# Patient Record
Sex: Male | Born: 1955 | Race: White | Hispanic: No | Marital: Married | Smoking: Former smoker
Health system: Southern US, Community
[De-identification: ages and names within clinical notes are randomized; demographics above are authoritative.]

## PROBLEM LIST (undated history)

## (undated) DIAGNOSIS — I2510 Atherosclerotic heart disease of native coronary artery without angina pectoris: Secondary | ICD-10-CM

## (undated) DIAGNOSIS — E559 Vitamin D deficiency, unspecified: Secondary | ICD-10-CM

## (undated) DIAGNOSIS — E7800 Pure hypercholesterolemia, unspecified: Secondary | ICD-10-CM

## (undated) DIAGNOSIS — K219 Gastro-esophageal reflux disease without esophagitis: Secondary | ICD-10-CM

## (undated) DIAGNOSIS — I10 Essential (primary) hypertension: Secondary | ICD-10-CM

## (undated) DIAGNOSIS — M5416 Radiculopathy, lumbar region: Secondary | ICD-10-CM

## (undated) DIAGNOSIS — E66811 Obesity, class 1: Secondary | ICD-10-CM

## (undated) DIAGNOSIS — I471 Supraventricular tachycardia, unspecified: Secondary | ICD-10-CM

## (undated) DIAGNOSIS — Z87891 Personal history of nicotine dependence: Secondary | ICD-10-CM

## (undated) DIAGNOSIS — I350 Nonrheumatic aortic (valve) stenosis: Secondary | ICD-10-CM

## (undated) DIAGNOSIS — M5412 Radiculopathy, cervical region: Secondary | ICD-10-CM

## (undated) DIAGNOSIS — E119 Type 2 diabetes mellitus without complications: Secondary | ICD-10-CM

## (undated) DIAGNOSIS — G894 Chronic pain syndrome: Secondary | ICD-10-CM

## (undated) HISTORY — PX: OTHER SURGICAL HISTORY: SHX169

## (undated) HISTORY — DX: Supraventricular tachycardia, unspecified: I47.10

## (undated) HISTORY — DX: Radiculopathy, cervical region: M54.12

## (undated) HISTORY — DX: Type 2 diabetes mellitus without complications: E11.9

## (undated) HISTORY — DX: Nonrheumatic aortic (valve) stenosis: I35.0

## (undated) HISTORY — DX: Obesity, class 1: E66.811

## (undated) HISTORY — DX: Gastro-esophageal reflux disease without esophagitis: K21.9

## (undated) HISTORY — DX: Atherosclerotic heart disease of native coronary artery without angina pectoris: I25.10

## (undated) HISTORY — PX: TRANSTHORACIC ECHOCARDIOGRAM: SHX275

## (undated) HISTORY — DX: Radiculopathy, lumbar region: M54.16

## (undated) HISTORY — DX: Vitamin D deficiency, unspecified: E55.9

## (undated) HISTORY — DX: Personal history of nicotine dependence: Z87.891

## (undated) HISTORY — DX: Chronic pain syndrome: G89.4

## (undated) SURGICAL SUPPLY — 1 items: INQWIRE 1.5J .035X260CM (WIRE) ×1 IMPLANT

---

## 2003-09-19 ENCOUNTER — Ambulatory Visit (HOSPITAL_COMMUNITY): Admission: RE | Admit: 2003-09-19 | Discharge: 2003-09-19 | Payer: Self-pay | Admitting: Cardiovascular Disease

## 2004-09-08 ENCOUNTER — Ambulatory Visit (HOSPITAL_COMMUNITY): Admission: RE | Admit: 2004-09-08 | Discharge: 2004-09-08 | Payer: Self-pay | Admitting: Internal Medicine

## 2009-12-12 ENCOUNTER — Ambulatory Visit (HOSPITAL_COMMUNITY): Admission: RE | Admit: 2009-12-12 | Discharge: 2009-12-12 | Payer: Self-pay | Admitting: Internal Medicine

## 2010-11-13 NOTE — Cardiovascular Report (Signed)
NAME:  Alan Grant, Alan Grant                         ACCOUNT NO.:  [id number]   MEDICAL RECORD NO.:  [id number]                   PATIENT TYPE:  OIB   LOCATION:  2899                                 FACILITY:  [facility]   PHYSICIAN:  Cristy Hilts. Jacinto Halim, M.D.                  DATE OF BIRTH:  May 16, 1956   DATE OF PROCEDURE:  09/19/2003  DATE OF DISCHARGE:  09/19/2003                              CARDIAC CATHETERIZATION   PROCEDURE PERFORMED:  1. Left ventriculography.  2. Selective right and left coronary arteriography.  3. Ascending aortogram.  4. Abdominal aortogram.  5. Right femoral angiography and closure of right femoral arterial access     with Angio-Seal.   INDICATION:  Mr. Alan Grant is a 55 year old Caucasian male with family  history of coronary artery disease and history of hyperlipidemia and  hypertension who was admitted through the office after he presented to the  office complaining of chest discomfort.  Although the chest pain had some  atypical qualities, he did have significant cardiac risk factors and hence  she was admitted to [facility] in outpatient setting for cardiac  catheterization.  This is being done to evaluate for coronary artery disease  and also to evaluate the coronary anatomy.   HEMODYNAMIC DATA:  1. The left ventricular pressures were 120/6 with end-diastolic pressure of     10 mmHg.  2. Aortic pressure 108/83 with a mean of 96 mmHg.  3. There was no pressure gradient across the aortic valve.   ANGIOGRAPHIC DATA:  Left ventricle:  Left ventricular systolic function was  normal and ejection fraction was estimated at 65%.  There is no significant  mitral regurgitation.   Right coronary artery:  The right coronary artery is a small vessel and is  nondominant.   Left main coronary artery:  Left main coronary artery is a large caliber  vessel. It is normal.   Circumflex coronary artery:  Circumflex coronary artery is a large caliber  vessel.  It gives  origin to a large obtuse marginal one and continues in the  distal territory as an obtuse marginal two and also gives origin to a PDA.  It is normal.   Left anterior descending artery:  Left anterior descending artery is a large  caliber vessel.  It gives origin to several small diagonals.  It is normal.  It wraps around the apex.   ASCENDING AORTOGRAM:  Revealed presence of three aortic valve cusps.  There  is no evidence of aortic regurgitation.  No evidence of aortic dissection.   ABDOMINAL AORTOGRAM:  Revealed presence of two renal arteries; one on either  side.  The left renal artery was normally situated.  However, the right  renal artery rose apparently very close to the aortoiliac bifurcation.  Otherwise, the renal arteries were normal.   There is a possible hiatal hernia noted during angiography.   IMPRESSION:  1. Normal  coronary arteries with normal left ventricular systolic function.  2. Apparent origin of the right renal artery close to the aortoiliac     bifurcation.  However, the lumen is normal of both the right and left.   RECOMMENDATIONS:  Evaluation for noncardiac cause of chest pain is  indicated.  Smoking cessation is indicated.  The patient will be continued  on proton pump inhibitors on a twice a day dose.  The patient will follow up  with Dr. Gabriel Grant.  If he continues to have recurrent chest pain,  further evaluation including GI evaluation may be indicated.   TECHNIQUE OF PROCEDURE:  Under usual sterile precautions, using a 6 French  right femoral arterial access, a 6 [place] multipurpose pigtail catheter was  advanced into the ascending aorta over a 0.035-inch J wire.  The catheter  was then gently advanced to the left ventricle and left ventricular  pressures were monitored.  Hand contrast injection of the left ventricle was  performed both in the LAO and RAO projection.  The catheter was flushed with  saline and pulled back into the ascending aorta  and pressure gradient across  the aortic valve was monitored.  The right coronary artery was selectively  engaged and angiography was performed.  In a similar fashion, left main  coronary artery was selectively engaged and angiography was performed.  Then, the catheter was pulled back through to the aorta and ascending  aortogram was performed and left ventricular catheter was pulled back into  the abdominal aorta and abdominal aortogram was performed.  Right  ventricular angiography was performed through the arterial access sheath and  access was closed with Angio-Seal with excellent hemostasis.  The patient  was transferred to recovery in stable condition.  The patient tolerated the  procedure well.                                               Cristy Hilts. Jacinto Halim, M.D.    Pilar Plate  D:  09/19/2003  T:  09/20/2003  Job:  [id number]

## 2014-07-28 ENCOUNTER — Encounter (HOSPITAL_COMMUNITY): Payer: Self-pay | Admitting: *Deleted

## 2014-07-28 DIAGNOSIS — Z9889 Other specified postprocedural states: Secondary | ICD-10-CM | POA: Insufficient documentation

## 2014-07-28 DIAGNOSIS — Z720 Tobacco use: Secondary | ICD-10-CM | POA: Diagnosis not present

## 2014-07-28 DIAGNOSIS — N433 Hydrocele, unspecified: Secondary | ICD-10-CM | POA: Diagnosis not present

## 2014-07-28 DIAGNOSIS — I10 Essential (primary) hypertension: Secondary | ICD-10-CM | POA: Diagnosis not present

## 2014-07-28 DIAGNOSIS — Z79899 Other long term (current) drug therapy: Secondary | ICD-10-CM | POA: Diagnosis not present

## 2014-07-28 DIAGNOSIS — N451 Epididymitis: Secondary | ICD-10-CM | POA: Diagnosis not present

## 2014-07-28 DIAGNOSIS — N508 Other specified disorders of male genital organs: Secondary | ICD-10-CM | POA: Diagnosis present

## 2014-07-28 NOTE — ED Notes (Signed)
Pt c/o pain and swelling to right side of scrotum that started this am

## 2014-07-29 ENCOUNTER — Emergency Department (HOSPITAL_COMMUNITY)
Admission: EM | Admit: 2014-07-29 | Discharge: 2014-07-29 | Disposition: A | Discharge status: Home / Self Care | Payer: Managed Care, Other (non HMO) | Attending: Emergency Medicine | Admitting: Emergency Medicine

## 2014-07-29 ENCOUNTER — Emergency Department (HOSPITAL_COMMUNITY): Payer: Managed Care, Other (non HMO)

## 2014-07-29 DIAGNOSIS — N5089 Other specified disorders of the male genital organs: Secondary | ICD-10-CM

## 2014-07-29 DIAGNOSIS — N433 Hydrocele, unspecified: Secondary | ICD-10-CM

## 2014-07-29 DIAGNOSIS — N451 Epididymitis: Secondary | ICD-10-CM

## 2014-07-29 DIAGNOSIS — N50811 Right testicular pain: Secondary | ICD-10-CM

## 2014-07-29 HISTORY — DX: Essential (primary) hypertension: I10

## 2014-07-29 LAB — URINALYSIS, ROUTINE W REFLEX MICROSCOPIC
Bilirubin Urine: NEGATIVE
Glucose, UA: NEGATIVE mg/dL
HGB URINE DIPSTICK: NEGATIVE
Ketones, ur: NEGATIVE mg/dL
Leukocytes, UA: NEGATIVE
Nitrite: NEGATIVE
Protein, ur: NEGATIVE mg/dL
Specific Gravity, Urine: 1.025 (ref 1.005–1.030)
UROBILINOGEN UA: 0.2 mg/dL (ref 0.0–1.0)
pH: 6 (ref 5.0–8.0)

## 2014-07-29 MED ORDER — LEVOFLOXACIN 500 MG PO TABS: 500.0000 mg | ORAL_TABLET | Freq: Every day | ORAL | Status: DC

## 2014-07-29 MED ORDER — FENTANYL CITRATE 0.05 MG/ML IJ SOLN
50.0000 ug | Freq: Once | INTRAMUSCULAR | Status: AC
  Administered 2014-07-29: 50 ug via NASAL
  Filled 2014-07-29: qty 2

## 2014-07-29 MED ORDER — NAPROXEN 500 MG PO TABS: ORAL_TABLET | ORAL | Status: DC

## 2014-07-29 MED ORDER — NAPROXEN 250 MG PO TABS
500.0000 mg | ORAL_TABLET | Freq: Once | ORAL | Status: AC
  Administered 2014-07-29: 500 mg via ORAL
  Filled 2014-07-29: qty 2

## 2014-07-29 MED ORDER — LEVOFLOXACIN 500 MG PO TABS
500.0000 mg | ORAL_TABLET | Freq: Once | ORAL | Status: AC
  Administered 2014-07-29: 500 mg via ORAL
  Filled 2014-07-29: qty 1

## 2014-07-29 NOTE — ED Provider Notes (Signed)
CSN: [id number]     Arrival date & time 07/28/14  2337 History   First MD Initiated Contact with Patient 07/29/14 0117     Chief Complaint  Patient presents with  . Testicle Pain     (Consider location/radiation/quality/duration/timing/severity/associated sxs/prior Treatment) HPI  Patient reports about 10:00 this morning he started have swelling and pain in his right scrotum. He states he is able to urinate normally. He denies any prior history of boils or abscesses. He denies fever, nausea, or vomiting. He states he's never had this before. He states that walking or changing positions makes the pain worse. Nothing makes it feel better. He does report he had a vasectomy done about 15 years ago. PCP Dr Gerarda Fraction  Past Medical History  Diagnosis Date  . Hypertension    Past Surgical History  Procedure Laterality Date  . Cardiac catherization     History reviewed. No pertinent family history. History  Substance Use Topics  . Smoking status: Current Every Day Smoker -- 1.00 packs/day  . Smokeless tobacco: Not on file  . 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     Allergies  Review of patient's allergies indicates no known allergies.  Home Medications   Prior to Admission medications   Medication Sig Start Date End Date Taking? Authorizing Provider  ALPRAZolam Duanne Moron) 1 MG tablet Take 1 mg by mouth 4 (four) times daily.   Yes Historical Provider, MD  oxyCODONE (OXY IR/ROXICODONE) 5 MG immediate release tablet Take 30 mg by mouth every 4 (four) hours.   Yes Historical Provider, MD  oxycodone (OXY-IR) 5 MG capsule Take 60 mg by mouth 2 (two) times daily.   Yes Historical Provider, MD  traMADol (ULTRAM) 50 MG tablet Take 100 mg by mouth every 6 (six) hours.   Yes Historical Provider, MD   2 different BP pills   BP 171/97 mmHg  Pulse 86  Temp(Src) 97.9 F (36.6 C) (Oral)   Resp 18  Ht 6' (1.829 m)  Wt 226 lb 11.2 oz (102.83 kg)  BMI 30.74 kg/m2  SpO2 99%  Vital signs normal except for hypertension  Physical Exam  Constitutional: He is oriented to person, place, and time. He appears well-developed and well-nourished.  Non-toxic appearance. He does not appear ill. No distress.  HENT:  Head: Normocephalic and atraumatic.  Right Ear: External ear normal.  Left Ear: External ear normal.  Nose: Nose normal. No mucosal edema or rhinorrhea.  Mouth/Throat: Mucous membranes are normal. No dental abscesses or uvula swelling.  Eyes: Conjunctivae and EOM are normal. Pupils are equal, round, and reactive to light.  Neck: Normal range of motion and full passive range of motion without pain. Neck supple.  Pulmonary/Chest: Effort normal. No respiratory distress. He has no rhonchi. He exhibits no crepitus.  Abdominal: Normal appearance.  Genitourinary:  Patient is noted to have diffuse enlargement of his right testicle that is very tender to palpation. The left testicle feels normal and is nontender, and there is some increased fullness. The skin on his scrotum is normal. He also has some tenderness to palpation over the right epididymis. His penis is normal without drip.  Musculoskeletal: Normal range of motion. He exhibits no edema or tenderness.  Moves all extremities well.   Neurological: He is alert and oriented to person, place, and time. He has normal strength. No cranial nerve deficit.  Skin: Skin is  warm, dry and intact. No rash noted. No erythema. No pallor.  Psychiatric: He has a normal mood and affect. His speech is normal and behavior is normal. His mood appears not anxious.  Nursing note and vitals reviewed.   ED Course  Procedures (including critical care time)  Medications  fentaNYL (SUBLIMAZE) injection 50 mcg (50 mcg Nasal Given 07/29/14 0210)  naproxen (NAPROSYN) tablet 500 mg (500 mg Oral Given 07/29/14 0500)  levofloxacin (LEVAQUIN) tablet 500 mg  (500 mg Oral Given 07/29/14 0500)    Review of the [place] shows patient gets monthly prescriptions from his own PCP. He last received #120 alprazolam 1 mg tablets and #60 OxyContin 60 mg tablets on January 16. On January 9 he received #180 hydrocodone 10/325, #180 oxycodone 30 mg tablets, #240 tramadol 50 mg tablets.  Pt was given his test results and need for f/u if he isn't improving or if he gets worse.   Labs Review Results for orders placed or performed during the hospital encounter of 07/29/14  Urinalysis, Routine w reflex microscopic  Result Value Ref Range   Color, Urine YELLOW YELLOW   APPearance CLEAR CLEAR   Specific Gravity, Urine 1.025 1.005 - 1.030   pH 6.0 5.0 - 8.0   Glucose, UA NEGATIVE NEGATIVE mg/dL   Hgb urine dipstick NEGATIVE NEGATIVE   Bilirubin Urine NEGATIVE NEGATIVE   Ketones, ur NEGATIVE NEGATIVE mg/dL   Protein, ur NEGATIVE NEGATIVE mg/dL   Urobilinogen, UA 0.2 0.0 - 1.0 mg/dL   Nitrite NEGATIVE NEGATIVE   Leukocytes, UA NEGATIVE NEGATIVE   Laboratory interpretation all normal   Imaging Review US Scrotum US Art/ven Flow Abd Pelv Doppler  07/29/2014   CLINICAL DATA:  Testicle pain after bending over to help wife on Saturday, subsequent RIGHT testicle pain and swelling.  EXAM: SCROTAL ULTRASOUND  DOPPLER ULTRASOUND OF THE TESTICLES  TECHNIQUE: Complete ultrasound examination of the testicles, epididymis, and other scrotal structures was performed. Color and spectral Doppler ultrasound were also utilized to evaluate blood flow to the testicles.  COMPARISON:  None.  FINDINGS: Right testicle  Measurements: 4.1 x 2.9 x 3.2 cm. No mass or microlithiasis visualized.  Left testicle  Measurements: 4.7 x 2.3 x 3.5 cm. No mass or microlithiasis visualized.  Right epididymis:  Enlarged, heterogeneous and hypervascular.  Left epididymis:  Normal in size and appearance.  Hydrocele:  LEFT greater than RIGHT hydroceles.  Varicocele:  None visualized.  Pulsed  Doppler interrogation of both testes demonstrates low resistance arterial and venous waveforms bilaterally.  IMPRESSION: RIGHT epididymitis without orchitis.  RIGHT greater than LEFT hydroceles.   Electronically Signed   By: Elon Alas   On: 07/29/2014 04:40     EKG Interpretation None      MDM   Final diagnoses:  Swollen testicle  Pain in right testicle  Epididymitis, right  Hydrocele, left  Hydrocele, right     Discharge Medication List as of 07/29/2014  4:48 AM    START taking these medications   Details  levofloxacin (LEVAQUIN) 500 MG tablet Take 1 tablet (500 mg total) by mouth daily., Starting 07/29/2014, Until Discontinued, Print    naproxen (NAPROSYN) 500 MG tablet Take 1 po BID with food prn pain, Print        Plan discharge  Rolland Porter, MD, Alanson Aly, MD 07/29/14 [phone]

## 2014-07-29 NOTE — ED Notes (Signed)
Pt waiting on ultrasound.

## 2014-07-29 NOTE — ED Notes (Signed)
Discharge instructions and prescriptions given and reviewed with patient.  Patient verbalized understanding to complete all antibiotic and to follow up with urologist.  Patient ambulatory; discharged home in good condition.

## 2014-07-29 NOTE — Discharge Instructions (Signed)
Wear a athletic support for comfort. Soak in a tub of warm water for comfort. Take the naproxen for pain. Take the antibiotic until gone. Recheck if not improving in the next several days by [facility]. Call to get an appointment. Return to the ED for severe pain, vomiting, fever, inability to urinate.    Epididymitis Epididymitis is a swelling (inflammation) of the epididymis. The epididymis is a cord-like structure along the back part of the testicle. Epididymitis is usually, but not always, caused by infection. This is usually a sudden problem beginning with chills, fever and pain behind the scrotum and in the testicle. There may be swelling and redness of the testicle. DIAGNOSIS  Physical examination will reveal a tender, swollen epididymis. Sometimes, cultures are obtained from the urine or from prostate secretions to help find out if there is an infection or if the cause is a different problem. Sometimes, blood work is performed to see if your white blood cell count is elevated and if a germ (bacterial) or viral infection is present. Using this knowledge, an appropriate medicine which kills germs (antibiotic) can be chosen by your caregiver. A viral infection causing epididymitis will most often go away (resolve) without treatment. HOME CARE INSTRUCTIONS   Hot sitz baths for 20 minutes, 4 times per day, may help relieve pain.  Only take over-the-counter or prescription medicines for pain, discomfort or fever as directed by your caregiver.  Take all medicines, including antibiotics, as directed. Take the antibiotics for the full prescribed length of time even if you are feeling better.  It is very important to keep all follow-up appointments. SEEK IMMEDIATE MEDICAL CARE IF:   You have a fever.  You have pain not relieved with medicines.  You have any worsening of your problems.  Your pain seems to come and go.  You develop pain, redness, and swelling in the scrotum and  surrounding areas. MAKE SURE YOU:   Understand these instructions.  Will watch your condition.  Will get help right away if you are not doing well or get worse. Document Released: 06/11/2000 Document Revised: 09/06/2011 Document Reviewed: 05/01/2009 [facility] Patient Information 2015 [place], [place]. This information is not intended to replace advice given to you by your health care provider. Make sure you discuss any questions you have with your health care provider.  Hydrocele, Adult Fluid can collect around the testicles. This fluid forms in a sac. This condition is called a hydrocele. The collected fluid causes swelling of the scrotum. Usually, it affects just one testicle. Most of the time, the condition does not cause pain. Sometimes, the hydrocele goes away on its own. Other times, surgery is needed to get rid of the fluid. CAUSES A hydrocele does not develop often. Different things can cause a hydrocele in a man, including:  Injury to the scrotum.  Infection.  X-ray of the area around the scrotum.  A tumor or cancer of the testicle.  Twisting of a testicle.  Decreased blood flow to the scrotum. SYMPTOMS   Swelling without pain. The hydrocele feels like a water-filled balloon.  Swelling with pain. This can occur if the hydrocele was caused by infection or twisting.  Mild discomfort in the scrotum.  The hydrocele may feel heavy.  Swelling that gets smaller when you lie down. DIAGNOSIS  Your caregiver will do a physical exam to decide if you have a hydrocele. This may include:  Asking questions about your overall health, today and in the past. Your caregiver may ask  about any injuries, X-rays, or infections.  Pushing on your abdomen or asking you to change positions to see if the size of the hydrocele changes.  Shining a light through the scrotum (transillumination) to see if the fluid inside the scrotum is clear.  Blood tests and urine tests to check for  infection.  Imaging studies that take pictures of the scrotum and testicles. TREATMENT  Treatment depends in part on what caused the condition. Options include:  Watchful waiting. Your caregiver checks the hydrocele every so often.  Different surgeries to drain the fluid.  A needle may be put into the scrotum to drain fluid (needle aspiration). Fluid often returns after this type of treatment.  A cut (incision) may be made in the scrotum to remove the fluid sac (hydrocelectomy).  An incision may be made in the groin to repair a hydrocele that has contact with abdominal fluids (communicating hydrocele).  Medicines to treat an infection (antibiotics). HOME CARE INSTRUCTIONS  What you need to do at home may depend on the cause of the hydrocele and type of treatment. In general:  Take all medicine as directed by your caregiver. Follow the directions carefully.  Ask your caregiver if there is anything you should not do while you recover (activities, lifting, work, sex).  If you had surgery to repair a communicating hydrocele, recovery time may vary. Ask you caregiver about your recovery time.  Avoid heavy lifting for 4 to 6 weeks.  If you had an incision on the scrotum or groin, wash it for 2 to 3 days after surgery. Do this as long as the skin is closed and there are no gaps in the wound. Wash gently, and avoid rubbing the incision.  Keep all follow-up appointments. SEEK MEDICAL CARE IF:   Your scrotum seems to be getting larger.  The area becomes more and more uncomfortable. SEEK IMMEDIATE MEDICAL CARE IF:  You have a fever. Document Released: 12/02/2009 Document Revised: 04/04/2013 Document Reviewed: 12/02/2009 [facility] Patient Information 2015 [place], [place]. This information is not intended to replace advice given to you by your health care provider. Make sure you discuss any questions you have with your health care provider.

## 2015-10-04 IMAGING — US US SCROTUM
1 series · 14 of 25 positions shown · non-contrast
Comparison: None.

CLINICAL DATA: Testicle pain after bending over to help wife on
[REDACTED], subsequent RIGHT testicle pain and swelling.

EXAM:
SCROTAL ULTRASOUND
DOPPLER ULTRASOUND OF THE TESTICLES
TECHNIQUE: Complete ultrasound examination of the testicles, epididymis, and
other scrotal structures was performed. Color and spectral Doppler
ultrasound were also utilized to evaluate blood flow to the
testicles.

[Series 1: us scrotum · 0.06mm/px · 14 of 142 slices shown]
[im 1/142]
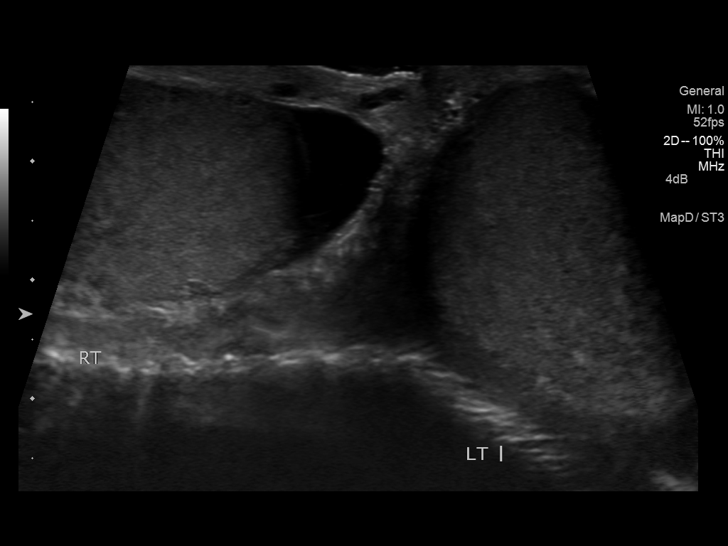
[im 12/142]
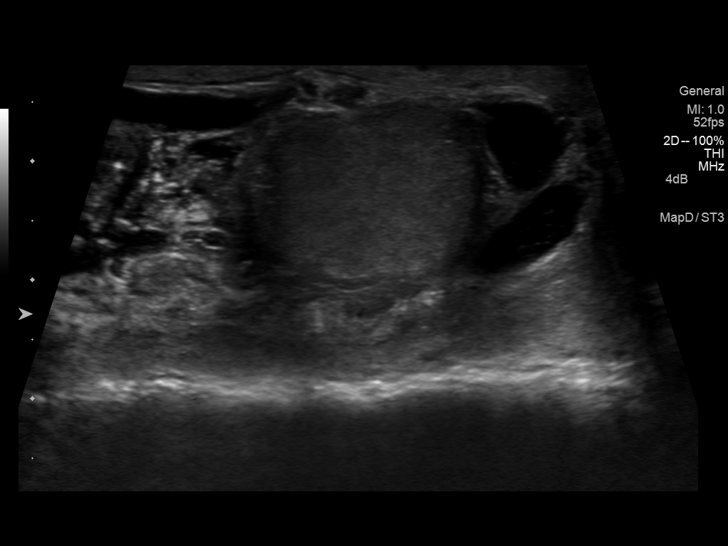
[im 24/142]
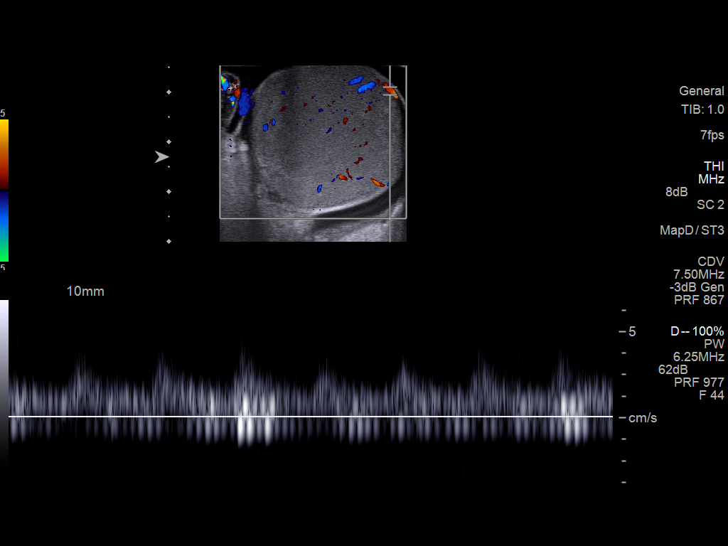
[im 36/142]
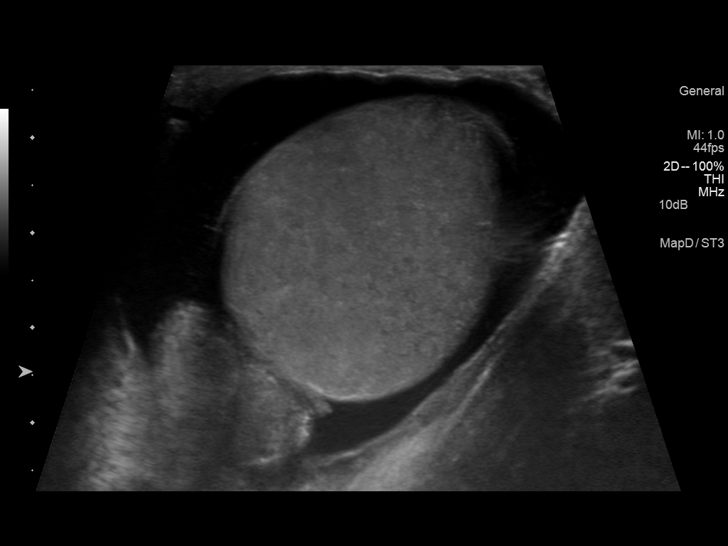
[im 48/142]
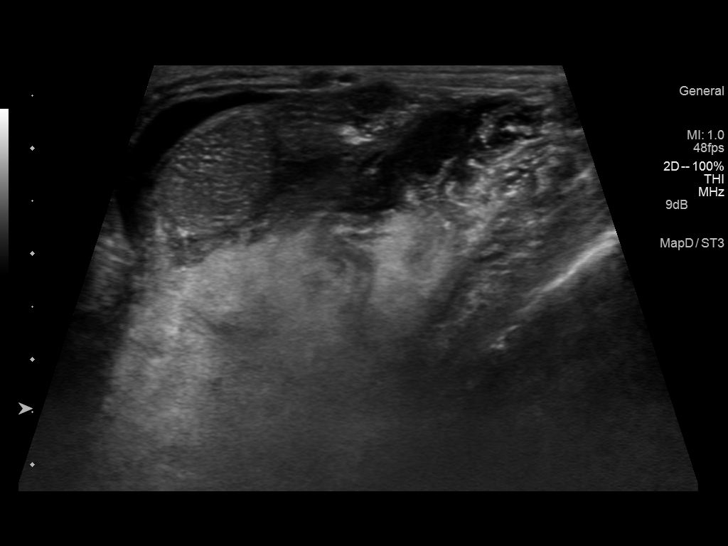
[im 53/142]
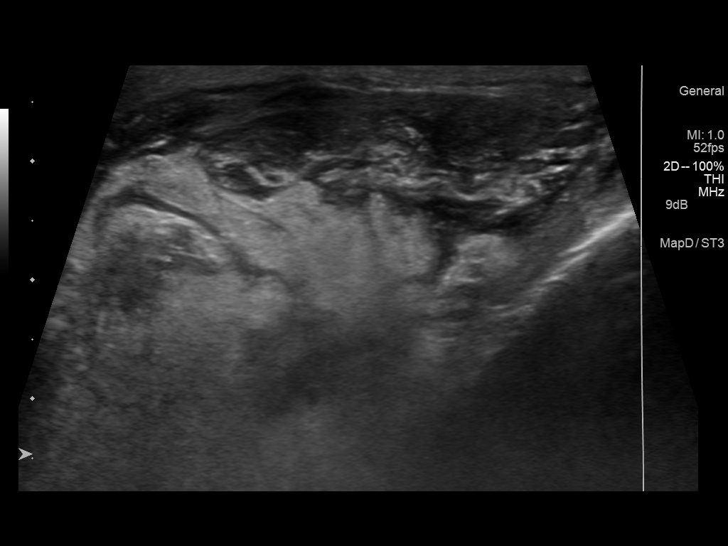
[im 65/142]
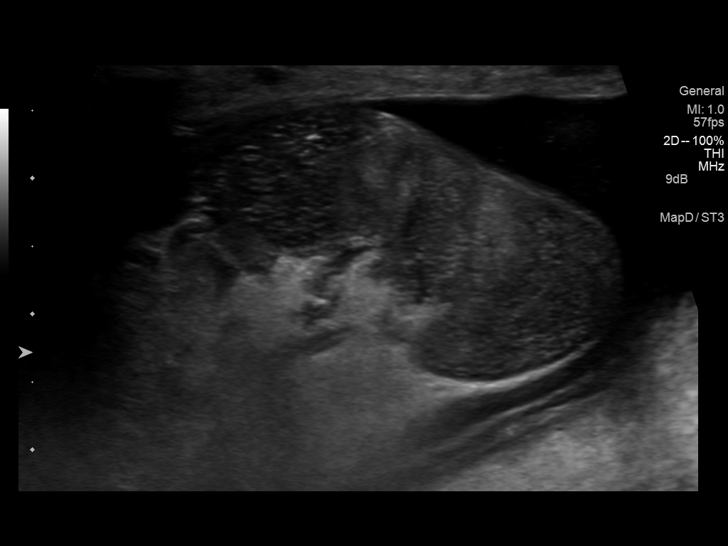
[im 77/142]
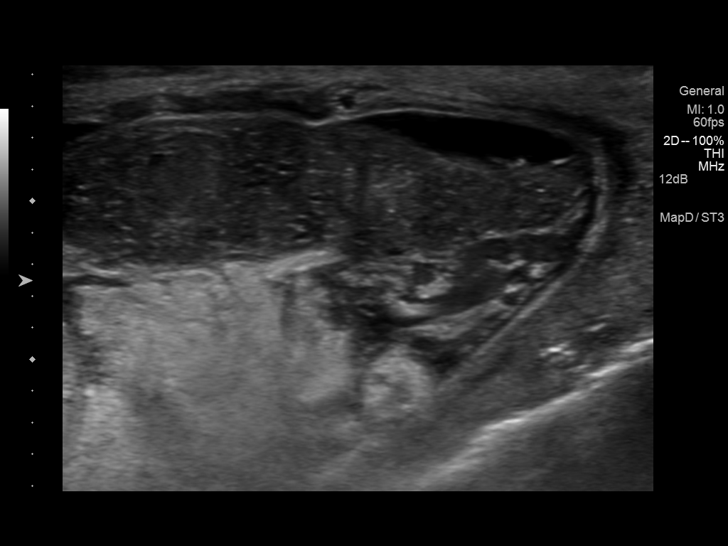
[im 89/142]
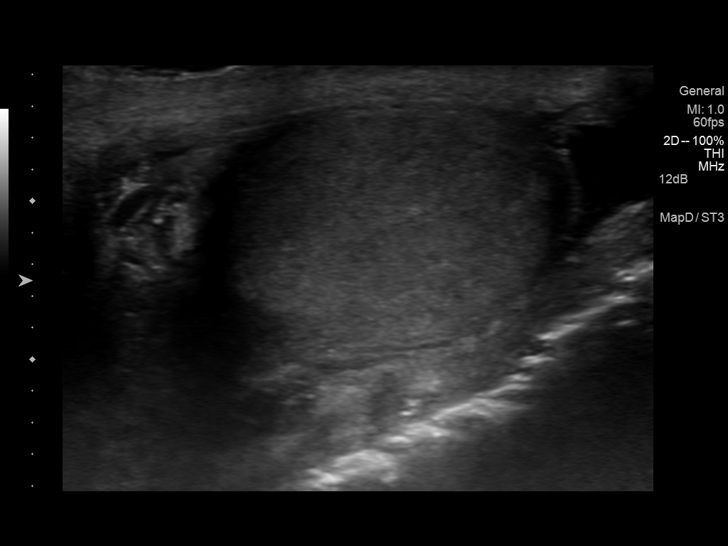
[im 95/142]
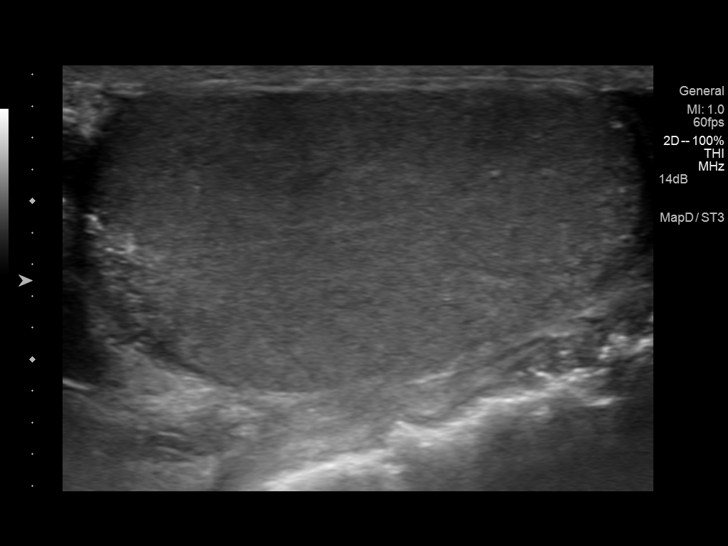
[im 106/142]
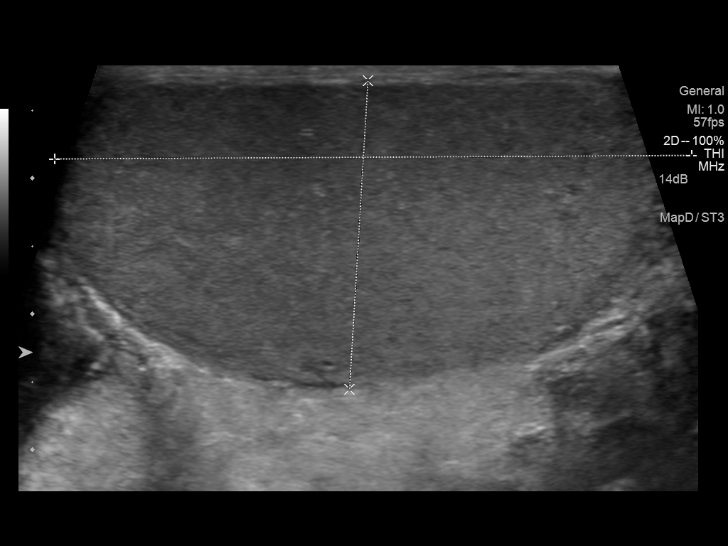
[im 118/142]
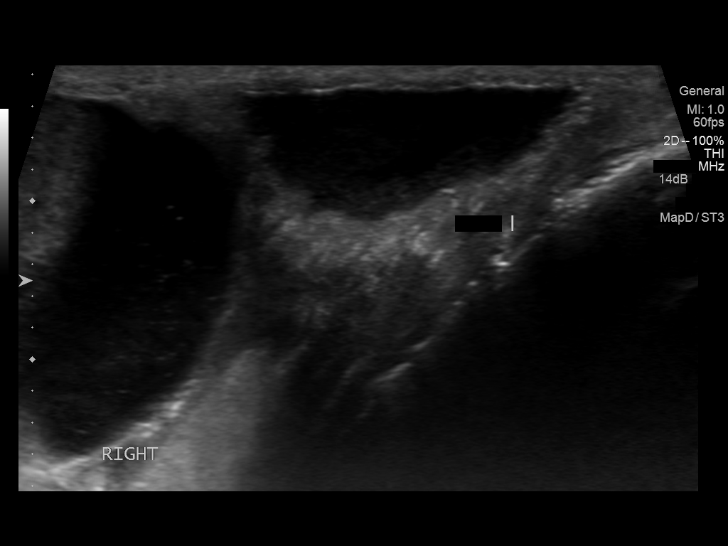
[im 130/142]
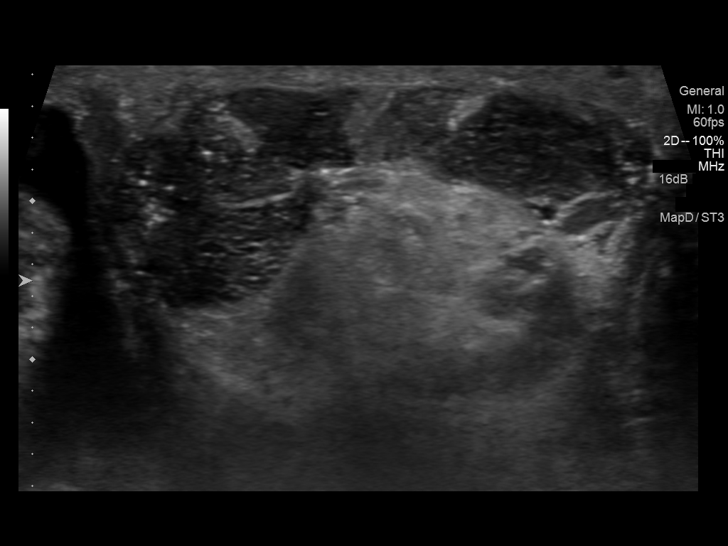
[im 142/142]
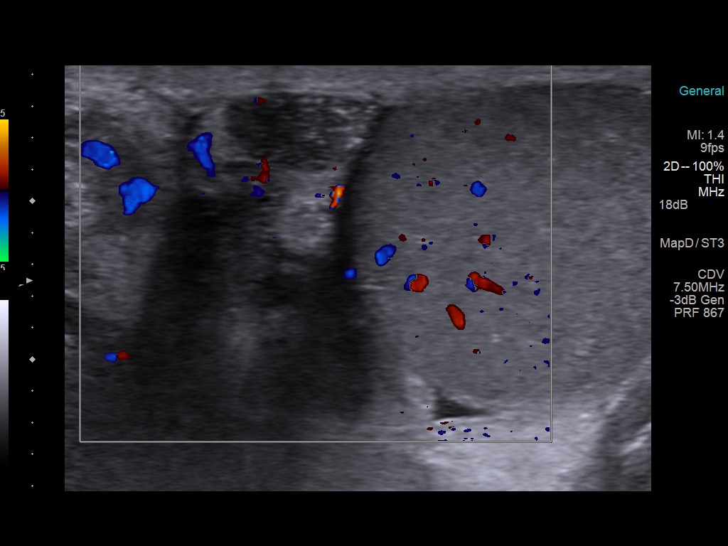

[14 of 25 positions shown; findings below may reference images not displayed]

FINDINGS: Right testicle

Measurements: 4.1 x 2.9 x 3.2 cm. No mass or microlithiasis
visualized.

Left testicle

Measurements: 4.7 x 2.3 x 3.5 cm. No mass or microlithiasis
visualized.

Right epididymis:  Enlarged, heterogeneous and hypervascular.

Left epididymis:  Normal in size and appearance.

Hydrocele:  LEFT greater than RIGHT hydroceles.

Varicocele:  None visualized.

Pulsed Doppler interrogation of both testes demonstrates low
resistance arterial and venous waveforms bilaterally.
IMPRESSION: RIGHT epididymitis without orchitis.

RIGHT greater than LEFT hydroceles.

  By: Melynda Billiot

## 2017-05-26 ENCOUNTER — Other Ambulatory Visit: Payer: Self-pay | Admitting: Internal Medicine

## 2017-05-26 DIAGNOSIS — M79605 Pain in left leg: Principal | ICD-10-CM

## 2017-05-26 DIAGNOSIS — M79604 Pain in right leg: Secondary | ICD-10-CM

## 2018-04-18 ENCOUNTER — Encounter: Payer: Self-pay | Admitting: Gastroenterology

## 2018-07-20 ENCOUNTER — Other Ambulatory Visit: Payer: Self-pay | Admitting: *Deleted

## 2018-07-20 ENCOUNTER — Telehealth: Payer: Self-pay | Admitting: *Deleted

## 2018-07-20 ENCOUNTER — Encounter: Payer: Self-pay | Admitting: Nurse Practitioner

## 2018-07-20 ENCOUNTER — Ambulatory Visit (INDEPENDENT_AMBULATORY_CARE_PROVIDER_SITE_OTHER): Payer: No Typology Code available for payment source | Admitting: Nurse Practitioner

## 2018-07-20 DIAGNOSIS — Z0000 Encounter for general adult medical examination without abnormal findings: Secondary | ICD-10-CM | POA: Insufficient documentation

## 2018-07-20 DIAGNOSIS — Z1211 Encounter for screening for malignant neoplasm of colon: Secondary | ICD-10-CM

## 2018-07-20 MED ORDER — PEG 3350-KCL-NA BICARB-NACL 420 G PO SOLR: 4000.0000 mL | Freq: Once | ORAL | 0 refills | Status: AC

## 2018-07-20 NOTE — Telephone Encounter (Signed)
Pre-op is scheduled for 08/11/2018 at 9:00am. Patient aware. Letter mailed.

## 2018-07-20 NOTE — Assessment & Plan Note (Signed)
The patient is currently overdue for first ever screening colonoscopy.  He was referred to our office due to need for augmented sedation.  He is generally asymptomatic from a GI standpoint.  We will proceed with colonoscopy at this time.  Proceed with TCS on propofol/MAC with Dr. Gala Romney in near future: the risks, benefits, and alternatives have been discussed with the patient in detail. The patient states understanding and desires to proceed.  The patient is currently on Neurontin, oxycodone.  No other anticoagulants, anxiolytics, chronic pain medication, or antidepressants.  Denies alcohol and drug use.  We will plan for the procedure on propofol/MAC to promote adequate sedation.

## 2018-07-20 NOTE — Progress Notes (Signed)
CC'D TO PCP °

## 2018-07-20 NOTE — H&P (View-Only) (Signed)
Primary Care Physician:  Redmond School, MD Primary Gastroenterologist:  Dr. Gala Romney  Chief Complaint  Patient presents with  . Colonoscopy    REF BY DR. Gerarda Fraction    HPI:   Alan Grant is a 63 y.o. male who presents on referral from primary care to schedule a colonoscopy.  Nurse/phone triage was deferred to office visit due to medications likely requiring augmented sedation.  Reviewed information provided with the referral including medication list indicating likely need for augmented sedation.  No history of colonoscopy in our system.  Today he states he's doing well overall. Has never had a colonoscopy. Denies abdominal pain, N/V, hematochezia, melena, fever, chills, unintentional weight loss. Has had intermittent chest pain for years, cardiac cath was normal several years ago. Denies dyspnea, dizziness, lightheadedness, syncope, near syncope. Denies any other upper or lower GI symptoms.  Past Medical History:  Diagnosis Date  . Hypertension     Past Surgical History:  Procedure Laterality Date  . cardiac catherization      Current Outpatient Medications  Medication Sig Dispense Refill  . enalapril-hydrochlorothiazide (VASERETIC) 10-25 MG tablet TK 1 T PO D    . gabapentin (NEURONTIN) 100 MG capsule     . oxyCODONE (OXY IR/ROXICODONE) 5 MG immediate release tablet Take 30 mg by mouth every 4 (four) hours.    Marland Kitchen oxycodone (OXY-IR) 5 MG capsule Take 60 mg by mouth 2 (two) times daily.    . tamsulosin (FLOMAX) 0.4 MG CAPS capsule TAKE 1 CAPSULE PO QD    . testosterone cypionate (DEPOTESTOSTERONE CYPIONATE) 200 MG/ML injection INJECT 1 ML Q 2 WEEKS    . XTAMPZA ER 36 MG C12A TK 2 CS PO BID     No current facility-administered medications for this visit.     Allergies as of 07/20/2018  . (No Known Allergies)    Family History  Problem Relation Age of Onset  . Colon cancer Neg Hx     Social History   Socioeconomic History  . Marital status: Married    Spouse name:  Not on file  . Number of children: Not on file  . Years of education: Not on file  . Highest education level: Not on file  Occupational History  . Not on file  Social Needs  . Financial resource strain: Not on file  . Food insecurity:    Worry: Not on file    Inability: Not on file  . Transportation needs:    Medical: Not on file    Non-medical: Not on file  Tobacco Use  . Smoking status: Current Every Day Smoker    Packs/day: 1.00  . Smokeless tobacco: Current User  Substance and Sexual Activity  . Alcohol use: No  . Drug use: No  . Sexual activity: Not on file  Lifestyle  . Physical activity:    Days per week: Not on file    Minutes per session: Not on file  . Stress: Not on file  Relationships  . Social connections:    Talks on phone: Not on file    Gets together: Not on file    Attends religious service: Not on file    Active member of club or organization: Not on file    Attends meetings of clubs or organizations: Not on file    Relationship status: Not on file  . Intimate partner violence:    Fear of current or ex partner: Not on file    Emotionally abused: Not on file  Physically abused: Not on file    Forced sexual activity: Not on file  Other Topics Concern  . Not on file  Social History Narrative  . Not on file    Review of Systems: General: Negative for anorexia, weight loss, fever, chills, fatigue, weakness. ENT: Negative for hoarseness, difficulty swallowing. CV: Negative for chest pain, angina, palpitations, peripheral edema.  Respiratory: Negative for dyspnea at rest, cough, sputum, wheezing.  GI: See history of present illness. MS: Admits chronic pain.  Derm: Negative for rash or itching.  Endo: Negative for unusual weight change.  Heme: Negative for bruising or bleeding. Allergy: Negative for rash or hives.    Physical Exam: BP (!) 155/85   Pulse 88   Temp (!) 97.3 F (36.3 C) (Oral)   Ht 6' (1.829 m)   Wt 231 lb (104.8 kg)   BMI  31.33 kg/m  General:   Alert and oriented. Pleasant and cooperative. Well-nourished and well-developed.  Head:  Normocephalic and atraumatic. Eyes:  Without icterus, sclera clear and conjunctiva pink.  Ears:  Normal auditory acuity. Cardiovascular:  S1, S2 present without murmurs appreciated. Extremities without clubbing or edema. Respiratory:  Clear to auscultation bilaterally. No wheezes, rales, or rhonchi. No distress.  Gastrointestinal:  +BS, soft, non-tender and non-distended. No HSM noted. No guarding or rebound. No masses appreciated.  Rectal:  Deferred  Musculoskalatal:  Symmetrical without gross deformities. Skin:  Intact without significant lesions or rashes. Neurologic:  Alert and oriented x4;  grossly normal neurologically. Psych:  Alert and cooperative. Normal mood and affect. Heme/Lymph/Immune: No excessive bruising noted.    07/20/2018 9:02 AM   Disclaimer: This note was dictated with voice recognition software. Similar sounding words can inadvertently be transcribed and may not be corrected upon review.

## 2018-07-20 NOTE — Patient Instructions (Signed)
Your health issues we discussed today were:   Need for first-ever colonoscopy: 1. I am glad you are feeling well 2. We will schedule your colonoscopy for you. 3. Further recommendations will be made after your colonoscopy.  Overall I recommend:  1. Return for follow-up based on recommendations made after your colonoscopy. 2. Call us if you have any questions or concerns.  At [facility] we value your feedback. You may receive a survey about your visit today. Please share your experience as we strive to create trusting relationships with our patients to provide genuine, compassionate, quality care.  We appreciate your understanding and patience as we review any laboratory studies, imaging, and other diagnostic tests that are ordered as we care for you. Our office policy is 5 business days for review of these results, and any emergent or urgent results are addressed in a timely manner for your best interest. If you do not hear from our office in 1 week, please contact us.   We also encourage the use of MyChart, which contains your medical information for your review as well. If you are not enrolled in this feature, an access code is on this after visit summary for your convenience. Thank you for allowing [place] to be involved in your care.  It was great to see you today!  I hope you have a great day!!

## 2018-07-20 NOTE — Progress Notes (Signed)
Primary Care Physician:  Redmond School, MD Primary Gastroenterologist:  Dr. Gala Romney  Chief Complaint  Patient presents with  . Colonoscopy    REF BY DR. Gerarda Fraction    HPI:   Alan Grant is a 63 y.o. male who presents on referral from primary care to schedule a colonoscopy.  Nurse/phone triage was deferred to office visit due to medications likely requiring augmented sedation.  Reviewed information provided with the referral including medication list indicating likely need for augmented sedation.  No history of colonoscopy in our system.  Today he states he's doing well overall. Has never had a colonoscopy. Denies abdominal pain, N/V, hematochezia, melena, fever, chills, unintentional weight loss. Has had intermittent chest pain for years, cardiac cath was normal several years ago. Denies dyspnea, dizziness, lightheadedness, syncope, near syncope. Denies any other upper or lower GI symptoms.  Past Medical History:  Diagnosis Date  . Hypertension     Past Surgical History:  Procedure Laterality Date  . cardiac catherization      Current Outpatient Medications  Medication Sig Dispense Refill  . enalapril-hydrochlorothiazide (VASERETIC) 10-25 MG tablet TK 1 T PO D    . gabapentin (NEURONTIN) 100 MG capsule     . oxyCODONE (OXY IR/ROXICODONE) 5 MG immediate release tablet Take 30 mg by mouth every 4 (four) hours.    Marland Kitchen oxycodone (OXY-IR) 5 MG capsule Take 60 mg by mouth 2 (two) times daily.    . tamsulosin (FLOMAX) 0.4 MG CAPS capsule TAKE 1 CAPSULE PO QD    . testosterone cypionate (DEPOTESTOSTERONE CYPIONATE) 200 MG/ML injection INJECT 1 ML Q 2 WEEKS    . XTAMPZA ER 36 MG C12A TK 2 CS PO BID     No current facility-administered medications for this visit.     Allergies as of 07/20/2018  . (No Known Allergies)    Family History  Problem Relation Age of Onset  . Colon cancer Neg Hx     Social History   Socioeconomic History  . Marital status: Married    Spouse name:  Not on file  . Number of children: Not on file  . Years of education: Not on file  . Highest education level: Not on file  Occupational History  . Not on file  Social Needs  . Financial resource strain: Not on file  . Food insecurity:    Worry: Not on file    Inability: Not on file  . Transportation needs:    Medical: Not on file    Non-medical: Not on file  Tobacco Use  . Smoking status: Current Every Day Smoker    Packs/day: 1.00  . Smokeless tobacco: Current User  Substance and Sexual Activity  . Alcohol use: No  . Drug use: No  . Sexual activity: Not on file  Lifestyle  . Physical activity:    Days per week: Not on file    Minutes per session: Not on file  . Stress: Not on file  Relationships  . Social connections:    Talks on phone: Not on file    Gets together: Not on file    Attends religious service: Not on file    Active member of club or organization: Not on file    Attends meetings of clubs or organizations: Not on file    Relationship status: Not on file  . Intimate partner violence:    Fear of current or ex partner: Not on file    Emotionally abused: Not on file  Physically abused: Not on file    Forced sexual activity: Not on file  Other Topics Concern  . Not on file  Social History Narrative  . Not on file    Review of Systems: General: Negative for anorexia, weight loss, fever, chills, fatigue, weakness. ENT: Negative for hoarseness, difficulty swallowing. CV: Negative for chest pain, angina, palpitations, peripheral edema.  Respiratory: Negative for dyspnea at rest, cough, sputum, wheezing.  GI: See history of present illness. MS: Admits chronic pain.  Derm: Negative for rash or itching.  Endo: Negative for unusual weight change.  Heme: Negative for bruising or bleeding. Allergy: Negative for rash or hives.    Physical Exam: BP (!) 155/85   Pulse 88   Temp (!) 97.3 F (36.3 C) (Oral)   Ht 6' (1.829 m)   Wt 231 lb (104.8 kg)   BMI  31.33 kg/m  General:   Alert and oriented. Pleasant and cooperative. Well-nourished and well-developed.  Head:  Normocephalic and atraumatic. Eyes:  Without icterus, sclera clear and conjunctiva pink.  Ears:  Normal auditory acuity. Cardiovascular:  S1, S2 present without murmurs appreciated. Extremities without clubbing or edema. Respiratory:  Clear to auscultation bilaterally. No wheezes, rales, or rhonchi. No distress.  Gastrointestinal:  +BS, soft, non-tender and non-distended. No HSM noted. No guarding or rebound. No masses appreciated.  Rectal:  Deferred  Musculoskalatal:  Symmetrical without gross deformities. Skin:  Intact without significant lesions or rashes. Neurologic:  Alert and oriented x4;  grossly normal neurologically. Psych:  Alert and cooperative. Normal mood and affect. Heme/Lymph/Immune: No excessive bruising noted.    07/20/2018 9:02 AM   Disclaimer: This note was dictated with voice recognition software. Similar sounding words can inadvertently be transcribed and may not be corrected upon review.

## 2018-07-20 NOTE — Telephone Encounter (Signed)
Spoke with patient and his TCS w/ mac is scheduled for 08/17/2018 at 10:45am. Instructions mailed. Prep sent to the pharmacy, Will call back with pre-op appt.   Called patient insurance at [phone] and spoke with Janett Billow (intake PA rep). PA was approved for TCS. Auth# [id number]. Dates 07/20/2018-10/19/2018.

## 2018-08-09 NOTE — Patient Instructions (Signed)
Alan Grant  08/09/2018     @PREFPERIOPPHARMACY @   Your procedure is scheduled on  08/17/2018.  Report to [facility] at  900   A.M.  Call this number if you have problems the morning of surgery:  [phone]   Remember:  Follow the diet and prep instructions given to you by Dr [name] office.                    Take these medicines the morning of surgery with A SIP OF WATER  Amlodipine, diltiazem, gabapentin, hydrocodone ( if needed), flomax.    Do not wear jewelry, make-up or nail polish.  Do not wear lotions, powders, or perfumes, or deodorant.  Do not shave 48 hours prior to surgery.  Men may shave face and neck.  Do not bring valuables to the hospital.  [facility] is not responsible for any belongings or valuables.  Contacts, dentures or bridgework may not be worn into surgery.  Leave your suitcase in the car.  After surgery it may be brought to your room.  For patients admitted to the hospital, discharge time will be determined by your treatment team.  Patients discharged the day of surgery will not be allowed to drive home.   Name and phone number of your driver:   family Special instructions:  None  Please read over the following fact sheets that you were given. Anesthesia Post-op Instructions and Care and Recovery After Surgery       Colonoscopy, Adult A colonoscopy is an exam to look at the large intestine. It is done to check for problems, such as:  Lumps (tumors).  Growths (polyps).  Swelling (inflammation).  Bleeding. What happens before the procedure? Eating and drinking Follow instructions from your doctor about eating and drinking. These instructions may include:  A few days before the procedure - follow a low-fiber diet. ? Avoid nuts. ? Avoid seeds. ? Avoid dried fruit. ? Avoid raw fruits. ? Avoid vegetables.  1-3 days before the procedure - follow a clear liquid diet. Avoid liquids that have red or purple dye. Drink only  clear liquids, such as: ? Clear broth or bouillon. ? Black coffee or tea. ? Clear juice. ? Clear soft drinks or sports drinks. ? Gelatin dessert. ? Popsicles.  On the day of the procedure - do not eat or drink anything during the 2 hours before the procedure. Up to 2 hours before the procedure, you may continue to drink clear liquids, such as water or clear fruit juice.  Bowel prep If you were prescribed an oral bowel prep:  Take it as told by your doctor. Starting the day before your procedure, you will need to drink a lot of liquid. The liquid will cause you to poop (have bowel movements) until your poop is almost clear or light green.  To clean out your colon, you may also be given: ? Laxative medicines. ? Instructions about how to use an enema.  If your skin or butt gets irritated from diarrhea, you may: ? Wipe the area with wipes that have medicine in them, such as adult wet wipes with aloe and vitamin E. ? Put something on your skin that soothes the area, such as petroleum jelly.  If you throw up (vomit) while drinking the bowel prep, take a break for up to 60 minutes. Then begin the bowel prep again. If you keep throwing up and you cannot  take the bowel prep without throwing up, call your doctor. General instructions  Ask your doctor about: ? Changing or stopping your normal medicines. This is important if you take iron pills, diabetes medicines, or blood thinners. ? Taking medicines such as aspirin and ibuprofen. These medicines can thin your blood. Do not take these medicines unless your doctor tells you to take them.  Plan to have someone take you home from the hospital or clinic. What happens during the procedure?   An IV tube may be put into one of your veins.  You will be given medicine to help you relax (sedative).  To reduce your risk of infection: ? Your doctors will wash their hands. ? Your anal area will be washed with soap.  You will be asked to lie on  your side with your knees bent.  Your doctor will get a long, thin, flexible tube ready. The tube will have a camera and a light on the end.  The tube will be put into your anus.  The tube will be gently put into your large intestine.  Air will be delivered into your large intestine to keep it open. You may feel some pressure or cramping.  The camera will be used to take photos.  A small tissue sample may be removed for testing (biopsy).  If small growths are found, your doctor may remove them and have them checked for cancer.  The tube that was put into your anus will be slowly removed. The procedure may vary among doctors and hospitals. What happens after the procedure?  Your doctor will check on you often until the medicines you were given have worn off.  Do not drive for 24 hours after the procedure.  You may have a small amount of blood in your poop.  You may pass gas.  You may have mild cramps or bloating in your belly (abdomen).  It is up to you to get the results of your procedure. Ask your doctor, or the department performing the procedure, when your results will be ready. Summary  A colonoscopy is an exam to look at the large intestine.  Follow instructions from your doctor about eating and drinking before the procedure.  If you were prescribed an oral bowel prep to clean out your colon, take it as told by your doctor.  Your doctor will check on you often until the medicines you were given have worn off.  Plan to have someone take you home from the hospital or clinic. This information is not intended to replace advice given to you by your health care provider. Make sure you discuss any questions you have with your health care provider. Document Released: 07/17/2010 Document Revised: 04/13/2017 Document Reviewed: 08/26/2015 Elsevier Interactive Patient Education  2019 [employer]  Colonoscopy, Adult, Care After This sheet gives you information about how to  care for yourself after your procedure. Your health care provider may also give you more specific instructions. If you have problems or questions, contact your health care provider. What can I expect after the procedure? After the procedure, it is common to have:  A small amount of blood in your stool for 24 hours after the procedure.  Some gas.  Mild abdominal cramping or bloating. Follow these instructions at home: General instructions  For the first 24 hours after the procedure: ? Do not drive or use machinery. ? Do not sign important documents. ? Do not drink alcohol. ? Do your regular daily activities at a  slower pace than normal. ? Eat soft, easy-to-digest foods.  Take over-the-counter or prescription medicines only as told by your health care provider. Relieving cramping and bloating   Try walking around when you have cramps or feel bloated.  Apply heat to your abdomen as told by your health care provider. Use a heat source that your health care provider recommends, such as a moist heat pack or a heating pad. ? Place a towel between your skin and the heat source. ? Leave the heat on for 20-30 minutes. ? Remove the heat if your skin turns bright red. This is especially important if you are unable to feel pain, heat, or cold. You may have a greater risk of getting burned. Eating and drinking   Drink enough fluid to keep your urine pale yellow.  Resume your normal diet as instructed by your health care provider. Avoid heavy or fried foods that are hard to digest.  Avoid drinking alcohol for as long as instructed by your health care provider. Contact a health care provider if:  You have blood in your stool 2-3 days after the procedure. Get help right away if:  You have more than a small spotting of blood in your stool.  You pass large blood clots in your stool.  Your abdomen is swollen.  You have nausea or vomiting.  You have a fever.  You have increasing  abdominal pain that is not relieved with medicine. Summary  After the procedure, it is common to have a small amount of blood in your stool. You may also have mild abdominal cramping and bloating.  For the first 24 hours after the procedure, do not drive or use machinery, sign important documents, or drink alcohol.  Contact your health care provider if you have a lot of blood in your stool, nausea or vomiting, a fever, or increased abdominal pain. This information is not intended to replace advice given to you by your health care provider. Make sure you discuss any questions you have with your health care provider. Document Released: 01/27/2004 Document Revised: 04/06/2017 Document Reviewed: 08/26/2015 Elsevier Interactive Patient Education  2019 [place] Anesthesia is a term that refers to techniques, procedures, and medicines that help a person stay safe and comfortable during a medical procedure. Monitored anesthesia care, or sedation, is one type of anesthesia. Your anesthesia specialist may recommend sedation if you will be having a procedure that does not require you to be unconscious, such as:  Cataract surgery.  A dental procedure.  A biopsy.  A colonoscopy. During the procedure, you may receive a medicine to help you relax (sedative). There are three levels of sedation:  Mild sedation. At this level, you may feel awake and relaxed. You will be able to follow directions.  Moderate sedation. At this level, you will be sleepy. You may not remember the procedure.  Deep sedation. At this level, you will be asleep. You will not remember the procedure. The more medicine you are given, the deeper your level of sedation will be. Depending on how you respond to the procedure, the anesthesia specialist may change your level of sedation or the type of anesthesia to fit your needs. An anesthesia specialist will monitor you closely during the procedure. Let  your health care provider know about:  Any allergies you have.  All medicines you are taking, including vitamins, herbs, eye drops, creams, and over-the-counter medicines.  Any use of steroids (by mouth or as a cream).  Any problems you or family members have had with sedatives and anesthetic medicines.  Any blood disorders you have.  Any surgeries you have had.  Any medical conditions you have, such as sleep apnea.  Whether you are pregnant or may be pregnant.  Any use of cigarettes, alcohol, or street drugs. What are the risks? Generally, this is a safe procedure. However, problems may occur, including:  Getting too much medicine (oversedation).  Nausea.  Allergic reaction to medicines.  Trouble breathing. If this happens, a breathing tube may be used to help with breathing. It will be removed when you are awake and breathing on your own.  Heart trouble.  Lung trouble. Before the procedure Staying hydrated Follow instructions from your health care provider about hydration, which may include:  Up to 2 hours before the procedure - you may continue to drink clear liquids, such as water, clear fruit juice, black coffee, and plain tea. Eating and drinking restrictions Follow instructions from your health care provider about eating and drinking, which may include:  8 hours before the procedure - stop eating heavy meals or foods such as meat, fried foods, or fatty foods.  6 hours before the procedure - stop eating light meals or foods, such as toast or cereal.  6 hours before the procedure - stop drinking milk or drinks that contain milk.  2 hours before the procedure - stop drinking clear liquids. Medicines Ask your health care provider about:  Changing or stopping your regular medicines. This is especially important if you are taking diabetes medicines or blood thinners.  Taking medicines such as aspirin and ibuprofen. These medicines can thin your blood. Do not take  these medicines before your procedure if your health care provider instructs you not to. Tests and exams  You will have a physical exam.  You may have blood tests done to show: ? How well your kidneys and liver are working. ? How well your blood can clot. General instructions  Plan to have someone take you home from the hospital or clinic.  If you will be going home right after the procedure, plan to have someone with you for 24 hours.  What happens during the procedure?  Your blood pressure, heart rate, breathing, level of pain and overall condition will be monitored.  An IV tube will be inserted into one of your veins.  Your anesthesia specialist will give you medicines as needed to keep you comfortable during the procedure. This may mean changing the level of sedation.  The procedure will be performed. After the procedure  Your blood pressure, heart rate, breathing rate, and blood oxygen level will be monitored until the medicines you were given have worn off.  Do not drive for 24 hours if you received a sedative.  You may: ? Feel sleepy, clumsy, or nauseous. ? Feel forgetful about what happened after the procedure. ? Have a sore throat if you had a breathing tube during the procedure. ? Vomit. This information is not intended to replace advice given to you by your health care provider. Make sure you discuss any questions you have with your health care provider. Document Released: 03/10/2005 Document Revised: 11/21/2015 Document Reviewed: 10/05/2015 Elsevier Interactive Patient Education  2019 [place], Care After These instructions provide you with information about caring for yourself after your procedure. Your health care provider may also give you more specific instructions. Your treatment has been planned according to current medical practices, but problems sometimes occur. Call  your health care provider if you have any problems or  questions after your procedure. What can I expect after the procedure? After your procedure, you may:  Feel sleepy for several hours.  Feel clumsy and have poor balance for several hours.  Feel forgetful about what happened after the procedure.  Have poor judgment for several hours.  Feel nauseous or vomit.  Have a sore throat if you had a breathing tube during the procedure. Follow these instructions at home: For at least 24 hours after the procedure:      Have a responsible adult stay with you. It is important to have someone help care for you until you are awake and alert.  Rest as needed.  Do not: ? Participate in activities in which you could fall or become injured. ? Drive. ? Use heavy machinery. ? Drink alcohol. ? Take sleeping pills or medicines that cause drowsiness. ? Make important decisions or sign legal documents. ? Take care of children on your own. Eating and drinking  Follow the diet that is recommended by your health care provider.  If you vomit, drink water, juice, or soup when you can drink without vomiting.  Make sure you have little or no nausea before eating solid foods. General instructions  Take over-the-counter and prescription medicines only as told by your health care provider.  If you have sleep apnea, surgery and certain medicines can increase your risk for breathing problems. Follow instructions from your health care provider about wearing your sleep device: ? Anytime you are sleeping, including during daytime naps. ? While taking prescription pain medicines, sleeping medicines, or medicines that make you drowsy.  If you smoke, do not smoke without supervision.  Keep all follow-up visits as told by your health care provider. This is important. Contact a health care provider if:  You keep feeling nauseous or you keep vomiting.  You feel light-headed.  You develop a rash.  You have a fever. Get help right away if:  You have  trouble breathing. Summary  For several hours after your procedure, you may feel sleepy and have poor judgment.  Have a responsible adult stay with you for at least 24 hours or until you are awake and alert. This information is not intended to replace advice given to you by your health care provider. Make sure you discuss any questions you have with your health care provider. Document Released: 10/05/2015 Document Revised: 01/28/2017 Document Reviewed: 10/05/2015 Elsevier Interactive Patient Education  2019 [employer].

## 2018-08-11 ENCOUNTER — Other Ambulatory Visit: Payer: Self-pay

## 2018-08-11 ENCOUNTER — Encounter (HOSPITAL_COMMUNITY): Payer: Self-pay

## 2018-08-11 ENCOUNTER — Encounter (HOSPITAL_COMMUNITY)
Admission: RE | Admit: 2018-08-11 | Discharge: 2018-08-11 | Disposition: A | Discharge status: Home / Self Care | Payer: PRIVATE HEALTH INSURANCE | Source: Ambulatory Visit | Attending: Internal Medicine | Admitting: Internal Medicine

## 2018-08-11 DIAGNOSIS — Z01812 Encounter for preprocedural laboratory examination: Secondary | ICD-10-CM | POA: Insufficient documentation

## 2018-08-11 LAB — BASIC METABOLIC PANEL
Anion gap: 10 (ref 5–15)
BUN: 11 mg/dL (ref 6–20)
CO2: 24 mmol/L (ref 22–32)
Calcium: 10 mg/dL (ref 8.9–10.3)
Chloride: 102 mmol/L (ref 98–111)
Creatinine, Ser: 0.95 mg/dL (ref 0.61–1.24)
GFR calc Af Amer: >60 mL/min (ref >60)
GLUCOSE: 187 mg/dL — AB (ref 70–99)
POTASSIUM: 3.8 mmol/L (ref 3.5–5.1)
Sodium: 136 mmol/L (ref 135–145)

## 2018-08-17 ENCOUNTER — Ambulatory Visit (HOSPITAL_COMMUNITY): Payer: No Typology Code available for payment source | Admitting: Anesthesiology

## 2018-08-17 ENCOUNTER — Encounter (HOSPITAL_COMMUNITY): Payer: Self-pay | Admitting: *Deleted

## 2018-08-17 ENCOUNTER — Ambulatory Visit (HOSPITAL_COMMUNITY)
Admission: RE | Admit: 2018-08-17 | Discharge: 2018-08-17 | Disposition: A | Discharge status: Home / Self Care | Payer: No Typology Code available for payment source | Attending: Internal Medicine | Admitting: Internal Medicine

## 2018-08-17 ENCOUNTER — Encounter (HOSPITAL_COMMUNITY)
Admission: RE | Disposition: A | Discharge status: Home / Self Care | Payer: Self-pay | Source: Home / Self Care | Attending: Internal Medicine

## 2018-08-17 DIAGNOSIS — Z79899 Other long term (current) drug therapy: Secondary | ICD-10-CM | POA: Insufficient documentation

## 2018-08-17 DIAGNOSIS — D124 Benign neoplasm of descending colon: Secondary | ICD-10-CM

## 2018-08-17 DIAGNOSIS — D120 Benign neoplasm of cecum: Secondary | ICD-10-CM | POA: Insufficient documentation

## 2018-08-17 DIAGNOSIS — F17210 Nicotine dependence, cigarettes, uncomplicated: Secondary | ICD-10-CM | POA: Insufficient documentation

## 2018-08-17 DIAGNOSIS — K635 Polyp of colon: Secondary | ICD-10-CM | POA: Diagnosis not present

## 2018-08-17 DIAGNOSIS — Z79891 Long term (current) use of opiate analgesic: Secondary | ICD-10-CM | POA: Insufficient documentation

## 2018-08-17 DIAGNOSIS — Z1211 Encounter for screening for malignant neoplasm of colon: Secondary | ICD-10-CM | POA: Insufficient documentation

## 2018-08-17 DIAGNOSIS — I10 Essential (primary) hypertension: Secondary | ICD-10-CM | POA: Diagnosis not present

## 2018-08-17 HISTORY — PX: COLONOSCOPY WITH PROPOFOL: SHX5780

## 2018-08-17 HISTORY — PX: POLYPECTOMY: SHX5525

## 2018-08-17 LAB — GLUCOSE, CAPILLARY: Glucose-Capillary: 119 mg/dL — ABNORMAL HIGH (ref 70–99)

## 2018-08-17 SURGERY — COLONOSCOPY WITH PROPOFOL
Anesthesia: General

## 2018-08-17 MED ORDER — CHLORHEXIDINE GLUCONATE CLOTH 2 % EX PADS: 6.0000 | MEDICATED_PAD | Freq: Once | CUTANEOUS | Status: DC

## 2018-08-17 MED ORDER — PROPOFOL 10 MG/ML IV BOLUS
INTRAVENOUS | Status: DC | PRN
  Administered 2018-08-17: 10 mg via INTRAVENOUS
  Administered 2018-08-17 (×2): 20 mg via INTRAVENOUS
  Administered 2018-08-17: 30 mg via INTRAVENOUS

## 2018-08-17 MED ORDER — HYDROCODONE-ACETAMINOPHEN 7.5-325 MG PO TABS: 1.0000 | ORAL_TABLET | Freq: Once | ORAL | Status: DC | PRN

## 2018-08-17 MED ORDER — PROPOFOL 500 MG/50ML IV EMUL
INTRAVENOUS | Status: DC | PRN
  Administered 2018-08-17: 11:00:00 via INTRAVENOUS
  Administered 2018-08-17: 150 ug/kg/min via INTRAVENOUS

## 2018-08-17 MED ORDER — STERILE WATER FOR IRRIGATION IR SOLN
Status: DC | PRN
  Administered 2018-08-17: 1.5 mL

## 2018-08-17 MED ORDER — PROPOFOL 10 MG/ML IV BOLUS
INTRAVENOUS | Status: AC
  Filled 2018-08-17: qty 40

## 2018-08-17 MED ORDER — MIDAZOLAM HCL 2 MG/2ML IJ SOLN: 0.5000 mg | Freq: Once | INTRAMUSCULAR | Status: DC | PRN

## 2018-08-17 MED ORDER — HYDROMORPHONE HCL 1 MG/ML IJ SOLN: 0.2500 mg | INTRAMUSCULAR | Status: DC | PRN

## 2018-08-17 MED ORDER — PROMETHAZINE HCL 25 MG/ML IJ SOLN: 6.2500 mg | INTRAMUSCULAR | Status: DC | PRN

## 2018-08-17 MED ORDER — LACTATED RINGERS IV SOLN
INTRAVENOUS | Status: DC
  Administered 2018-08-17: 1000 mL via INTRAVENOUS

## 2018-08-17 NOTE — Transfer of Care (Signed)
Immediate Anesthesia Transfer of Care Note  Patient: Alan Grant  Procedure(s) Performed: COLONOSCOPY WITH PROPOFOL (N/A ) POLYPECTOMY  Patient Location: PACU  Anesthesia Type:General  Level of Consciousness: awake  Airway & Oxygen Therapy: Patient Spontanous Breathing  Post-op Assessment: Report given to RN  Post vital signs: Reviewed  Last Vitals:  Vitals Value Taken Time  BP    Temp    Pulse 73 08/17/2018 11:17 AM  Resp 21 08/17/2018 11:17 AM  SpO2 99 % 08/17/2018 11:17 AM  Vitals shown include unvalidated device data.  Last Pain:  Vitals:   08/17/18 1046  TempSrc:   PainSc: 0-No pain      Patients Stated Pain Goal: 9 (59/09/31 1216)  Complications: No apparent anesthesia complications

## 2018-08-17 NOTE — Op Note (Signed)
[facility] Patient Name: Alan Grant Procedure Date: 08/17/2018 10:46 AM MRN: [id number] Date of Birth: 06-Jan-1956 Attending MD: Norvel Richards , MD CSN: [id number] Age: 63 Admit Type: Outpatient Procedure:                Colonoscopy Indications:              Screening for colorectal malignant neoplasm Providers:                Norvel Richards, MD, Charlsie Quest. Theda Sers RN, RN,                            Nelma Rothman, Technician Referring MD:              Medicines:                Propofol per Anesthesia Complications:            No immediate complications. Estimated Blood Loss:     Estimated blood loss was minimal. Procedure:                Pre-Anesthesia Assessment:                           - Prior to the procedure, a History and Physical                            was performed, and patient medications and                            allergies were reviewed. The patient's tolerance of                            previous anesthesia was also reviewed. The risks                            and benefits of the procedure and the sedation                            options and risks were discussed with the patient.                            All questions were answered, and informed consent                            was obtained. Prior Anticoagulants: The patient has                            taken no previous anticoagulant or antiplatelet                            agents. ASA Grade Assessment: III - A patient with                            severe systemic disease. After reviewing the risks  and benefits, the patient was deemed in                            satisfactory condition to undergo the procedure.                           After obtaining informed consent, the colonoscope                            was passed under direct vision. Throughout the                            procedure, the patient's blood pressure, pulse, and          oxygen saturations were monitored continuously. The                            [id number]-[id number] ([id number]) scope was introduced through                            the and advanced to the the cecum, identified by                            appendiceal orifice and ileocecal valve. The                            colonoscopy was performed without difficulty. The                            patient tolerated the procedure well. The quality                            of the bowel preparation was adequate. Scope In: 10:54:29 AM Scope Out: 11:08:17 AM Scope Withdrawal Time: 0 hours 10 minutes 44 seconds  Total Procedure Duration: 0 hours 13 minutes 48 seconds  Findings:      The perianal and digital rectal examinations were normal.      Three semi-pedunculated polyps were found in the descending colon and       ileocecal valve. The polyps were 5 to 8 mm in size. These polyps were       removed with a cold snare. Resection and retrieval were complete.       Estimated blood loss was minimal.      The exam was otherwise without abnormality on direct and retroflexion       views. Impression:               - Three 5 to 8 mm polyps in the descending colon                            and at the ileocecal valve, removed with a cold                            snare. Resected and retrieved.                           -  The examination was otherwise normal on direct                            and retroflexion views. Moderate Sedation:      Moderate (conscious) sedation was personally administered by an       anesthesia professional. The following parameters were monitored: oxygen       saturation, heart rate, blood pressure, respiratory rate, EKG, adequacy       of pulmonary ventilation, and response to care. Recommendation:           - Patient has a contact number available for                            emergencies. The signs and symptoms of potential                            delayed complications were  discussed with the                            patient. Return to normal activities tomorrow.                            Written discharge instructions were provided to the                            patient.                           - Resume previous diet.                           - Continue present medications.                           - Await pathology results.                           - Repeat colonoscopy date to be determined after                            pending pathology results are reviewed for                            surveillance based on pathology results.                           - Return to GI office (date not yet determined). Procedure Code(s):        --- Professional ---                           [phone], Colonoscopy, flexible; with removal of                            tumor(s), polyp(s), or other lesion(s) by snare  technique Diagnosis Code(s):        --- Professional ---                           Z12.11, Encounter for screening for malignant                            neoplasm of colon                           D12.4, Benign neoplasm of descending colon                           D12.0, Benign neoplasm of cecum CPT copyright 2018 American Medical Association. All rights reserved. The codes documented in this report are preliminary and upon coder review may  be revised to meet current compliance requirements. Cristopher Estimable. Zitlali Primm, MD Norvel Richards, MD 08/17/2018 11:13:34 AM This report has been signed electronically. Number of Addenda: 0

## 2018-08-17 NOTE — Anesthesia Postprocedure Evaluation (Signed)
Anesthesia Post Note  Patient: Alan Grant  Procedure(s) Performed: COLONOSCOPY WITH PROPOFOL (N/A ) POLYPECTOMY  Patient location during evaluation: PACU Anesthesia Type: General Level of consciousness: awake and alert and oriented Pain management: pain level controlled Vital Signs Assessment: post-procedure vital signs reviewed and stable Respiratory status: spontaneous breathing Cardiovascular status: blood pressure returned to baseline and stable Postop Assessment: no apparent nausea or vomiting Anesthetic complications: no     Last Vitals:  Vitals:   08/17/18 0915  BP: (!) 157/80  Pulse: 83  Resp: 18  Temp: 36.7 C  SpO2: 96%    Last Pain:  Vitals:   08/17/18 1046  TempSrc:   PainSc: 0-No pain                 Keatin,Meilin Brosh

## 2018-08-17 NOTE — Anesthesia Preprocedure Evaluation (Signed)
Anesthesia Evaluation  Patient identified by MRN, date of birth, ID band Patient awake    Reviewed: Allergy & Precautions, NPO status , Patient's Chart, lab work & pertinent test results  Airway Mallampati: II  TM Distance: >3 FB Neck ROM: Full    Dental no notable dental hx. (+) Edentulous Upper, Edentulous Lower   Pulmonary neg pulmonary ROS, Current Smoker,  Probable COPD by h/o   Pulmonary exam normal breath sounds clear to auscultation       Cardiovascular Exercise Tolerance: Good hypertension, Pt. on medications negative cardio ROS Normal cardiovascular examI Rhythm:Regular Rate:Normal     Neuro/Psych negative neurological ROS  negative psych ROS   GI/Hepatic Neg liver ROS, GERD  Controlled,PRN meds -denies Sx today   Endo/Other  negative endocrine ROS  Renal/GU negative Renal ROS  negative genitourinary   Musculoskeletal negative musculoskeletal ROS (+)   Abdominal   Peds negative pediatric ROS (+)  Hematology negative hematology ROS (+)   Anesthesia Other Findings   Reproductive/Obstetrics negative OB ROS                             Anesthesia Physical Anesthesia Plan  ASA: II  Anesthesia Plan: General   Post-op Pain Management:    Induction: Intravenous  PONV Risk Score and Plan:   Airway Management Planned: Nasal Cannula and Simple Face Mask  Additional Equipment:   Intra-op Plan:   Post-operative Plan: Extubation in OR  Informed Consent: I have reviewed the patients History and Physical, chart, labs and discussed the procedure including the risks, benefits and alternatives for the proposed anesthesia with the patient or authorized representative who has indicated his/her understanding and acceptance.     Dental advisory given  Plan Discussed with: CRNA  Anesthesia Plan Comments:         Anesthesia Quick Evaluation

## 2018-08-17 NOTE — Discharge Instructions (Signed)
Colonoscopy Discharge Instructions  Read the instructions outlined below and refer to this sheet in the next few weeks. These discharge instructions provide you with general information on caring for yourself after you leave the hospital. Your doctor may also give you specific instructions. While your treatment has been planned according to the most current medical practices available, unavoidable complications occasionally occur. If you have any problems or questions after discharge, call Dr. Gala Romney at [phone]. ACTIVITY  You may resume your regular activity, but move at a slower pace for the next 24 hours.   Take frequent rest periods for the next 24 hours.   Walking will help get rid of the air and reduce the bloated feeling in your belly (abdomen).   No driving for 24 hours (because of the medicine (anesthesia) used during the test).    Do not sign any important legal documents or operate any machinery for 24 hours (because of the anesthesia used during the test).  NUTRITION  Drink plenty of fluids.   You may resume your normal diet as instructed by your doctor.   Begin with a light meal and progress to your normal diet. Heavy or fried foods are harder to digest and may make you feel sick to your stomach (nauseated).   Avoid alcoholic beverages for 24 hours or as instructed.  MEDICATIONS  You may resume your normal medications unless your doctor tells you otherwise.  WHAT YOU CAN EXPECT TODAY  Some feelings of bloating in the abdomen.   Passage of more gas than usual.   Spotting of blood in your stool or on the toilet paper.  IF YOU HAD POLYPS REMOVED DURING THE COLONOSCOPY:  No aspirin products for 7 days or as instructed.   No alcohol for 7 days or as instructed.   Eat a soft diet for the next 24 hours.  FINDING OUT THE RESULTS OF YOUR TEST Not all test results are available during your visit. If your test results are not back during the visit, make an appointment  with your caregiver to find out the results. Do not assume everything is normal if you have not heard from your caregiver or the medical facility. It is important for you to follow up on all of your test results.  SEEK IMMEDIATE MEDICAL ATTENTION IF:  You have more than a spotting of blood in your stool.   Your belly is swollen (abdominal distention).   You are nauseated or vomiting.   You have a temperature over 101.   You have abdominal pain or discomfort that is severe or gets worse throughout the day.   Colon polyp information provided  Further recommendations to follow pending review of pathology report      Colon Polyps  Polyps are tissue growths inside the body. Polyps can grow in many places, including the large intestine (colon). A polyp may be a round bump or a mushroom-shaped growth. You could have one polyp or several. Most colon polyps are noncancerous (benign). However, some colon polyps can become cancerous over time. Finding and removing the polyps early can help prevent this. What are the causes? The exact cause of colon polyps is not known. What increases the risk? You are more likely to develop this condition if you:  Have a family history of colon cancer or colon polyps.  Are older than 37 or older than 45 if you are African American.  Have inflammatory bowel disease, such as ulcerative colitis or Crohn's disease.  Have certain hereditary  conditions, such as: ? Familial adenomatous polyposis. ? Lynch syndrome. ? Turcot syndrome. ? Peutz-Jeghers syndrome.  Are overweight.  Smoke cigarettes.  Do not get enough exercise.  Drink too much alcohol.  Eat a diet that is high in fat and red meat and low in fiber.  Had childhood cancer that was treated with abdominal radiation. What are the signs or symptoms? Most polyps do not cause symptoms. If you have symptoms, they may include:  Blood coming from your rectum when having a bowel  movement.  Blood in your stool. The stool may look dark red or black.  Abdominal pain.  A change in bowel habits, such as constipation or diarrhea. How is this diagnosed? This condition is diagnosed with a colonoscopy. This is a procedure in which a lighted, flexible scope is inserted into the anus and then passed into the colon to examine the area. Polyps are sometimes found when a colonoscopy is done as part of routine cancer screening tests. How is this treated? Treatment for this condition involves removing any polyps that are found. Most polyps can be removed during a colonoscopy. Those polyps will then be tested for cancer. Additional treatment may be needed depending on the results of testing. Follow these instructions at home: Lifestyle  Maintain a healthy weight, or lose weight if recommended by your health care provider.  Exercise every day or as told by your health care provider.  Do not use any products that contain nicotine or tobacco, such as cigarettes and e-cigarettes. If you need help quitting, ask your health care provider.  If you drink alcohol, limit how much you have: ? 0-1 drink a day for women. ? 0-2 drinks a day for men.  Be aware of how much alcohol is in your drink. In the [place] one drink equals one 12 oz bottle of beer (355 mL), one 5 oz glass of wine (148 mL), or one 1 oz shot of hard liquor (44 mL). Eating and drinking   Eat foods that are high in fiber, such as fruits, vegetables, and whole grains.  Eat foods that are high in calcium and vitamin D, such as milk, cheese, yogurt, eggs, liver, fish, and broccoli.  Limit foods that are high in fat, such as fried foods and desserts.  Limit the amount of red meat and processed meat you eat, such as hot dogs, sausage, bacon, and lunch meats. General instructions  Keep all follow-up visits as told by your health care provider. This is important. ? This includes having regularly scheduled  colonoscopies. ? Talk to your health care provider about when you need a colonoscopy. Contact a health care provider if:  You have new or worsening bleeding during a bowel movement.  You have new or increased blood in your stool.  You have a change in bowel habits.  You lose weight for no known reason. Summary  Polyps are tissue growths inside the body. Polyps can grow in many places, including the colon.  Most colon polyps are noncancerous (benign), but some can become cancerous over time.  This condition is diagnosed with a colonoscopy.  Treatment for this condition involves removing any polyps that are found. Most polyps can be removed during a colonoscopy. This information is not intended to replace advice given to you by your health care provider. Make sure you discuss any questions you have with your health care provider. Document Released: 03/10/2004 Document Revised: 09/29/2017 Document Reviewed: 09/29/2017 Elsevier Interactive Patient Education  2019 [employer].  Monitored Anesthesia Care, Care After These instructions provide you with information about caring for yourself after your procedure. Your health care provider may also give you more specific instructions. Your treatment has been planned according to current medical practices, but problems sometimes occur. Call your health care provider if you have any problems or questions after your procedure. What can I expect after the procedure? After your procedure, you may:  Feel sleepy for several hours.  Feel clumsy and have poor balance for several hours.  Feel forgetful about what happened after the procedure.  Have poor judgment for several hours.  Feel nauseous or vomit.  Have a sore throat if you had a breathing tube during the procedure. Follow these instructions at home: For at least 24 hours after the procedure:      Have a responsible adult stay with you. It is important to have someone help  care for you until you are awake and alert.  Rest as needed.  Do not: ? Participate in activities in which you could fall or become injured. ? Drive. ? Use heavy machinery. ? Drink alcohol. ? Take sleeping pills or medicines that cause drowsiness. ? Make important decisions or sign legal documents. ? Take care of children on your own. Eating and drinking  Follow the diet that is recommended by your health care provider.  If you vomit, drink water, juice, or soup when you can drink without vomiting.  Make sure you have little or no nausea before eating solid foods. General instructions  Take over-the-counter and prescription medicines only as told by your health care provider.  If you have sleep apnea, surgery and certain medicines can increase your risk for breathing problems. Follow instructions from your health care provider about wearing your sleep device: ? Anytime you are sleeping, including during daytime naps. ? While taking prescription pain medicines, sleeping medicines, or medicines that make you drowsy.  If you smoke, do not smoke without supervision.  Keep all follow-up visits as told by your health care provider. This is important. Contact a health care provider if:  You keep feeling nauseous or you keep vomiting.  You feel light-headed.  You develop a rash.  You have a fever. Get help right away if:  You have trouble breathing. Summary  For several hours after your procedure, you may feel sleepy and have poor judgment.  Have a responsible adult stay with you for at least 24 hours or until you are awake and alert. This information is not intended to replace advice given to you by your health care provider. Make sure you discuss any questions you have with your health care provider. Document Released: 10/05/2015 Document Revised: 01/28/2017 Document Reviewed: 10/05/2015 Elsevier Interactive Patient Education  2019 [employer].

## 2018-08-17 NOTE — Interval H&P Note (Signed)
History and Physical Interval Note:  08/17/2018 10:40 AM  Alan Grant  has presented today for surgery, with the diagnosis of screening colonoscopy  The various methods of treatment have been discussed with the patient and family. After consideration of risks, benefits and other options for treatment, the patient has consented to  Procedure(s) with comments: COLONOSCOPY WITH PROPOFOL (N/A) - 10:45am as a surgical intervention .  The patient's history has been reviewed, patient examined, no change in status, stable for surgery.  I have reviewed the patient's chart and labs.  Questions were answered to the patient's satisfaction.     [name]  No change.  Patient here for first ever average rescreening colonoscopy.  The risks, benefits, limitations, alternatives and imponderables have been reviewed with the patient. Questions have been answered. All parties are agreeable.

## 2018-08-19 ENCOUNTER — Encounter: Payer: Self-pay | Admitting: Internal Medicine

## 2018-08-21 ENCOUNTER — Encounter (HOSPITAL_COMMUNITY): Payer: Self-pay | Admitting: Internal Medicine

## 2019-09-11 ENCOUNTER — Other Ambulatory Visit: Payer: Self-pay | Admitting: Internal Medicine

## 2019-09-11 DIAGNOSIS — R945 Abnormal results of liver function studies: Secondary | ICD-10-CM

## 2021-07-08 DIAGNOSIS — Z6832 Body mass index (BMI) 32.0-32.9, adult: Secondary | ICD-10-CM | POA: Diagnosis not present

## 2021-07-08 DIAGNOSIS — E669 Obesity, unspecified: Secondary | ICD-10-CM | POA: Diagnosis not present

## 2021-07-08 DIAGNOSIS — G894 Chronic pain syndrome: Secondary | ICD-10-CM | POA: Diagnosis not present

## 2021-07-08 DIAGNOSIS — M1991 Primary osteoarthritis, unspecified site: Secondary | ICD-10-CM | POA: Diagnosis not present

## 2021-07-08 DIAGNOSIS — I10 Essential (primary) hypertension: Secondary | ICD-10-CM | POA: Diagnosis not present

## 2021-08-06 DIAGNOSIS — Z6833 Body mass index (BMI) 33.0-33.9, adult: Secondary | ICD-10-CM | POA: Diagnosis not present

## 2021-08-06 DIAGNOSIS — M1991 Primary osteoarthritis, unspecified site: Secondary | ICD-10-CM | POA: Diagnosis not present

## 2021-08-06 DIAGNOSIS — G894 Chronic pain syndrome: Secondary | ICD-10-CM | POA: Diagnosis not present

## 2021-08-06 DIAGNOSIS — E6609 Other obesity due to excess calories: Secondary | ICD-10-CM | POA: Diagnosis not present

## 2021-09-03 DIAGNOSIS — M1991 Primary osteoarthritis, unspecified site: Secondary | ICD-10-CM | POA: Diagnosis not present

## 2021-09-03 DIAGNOSIS — Z6832 Body mass index (BMI) 32.0-32.9, adult: Secondary | ICD-10-CM | POA: Diagnosis not present

## 2021-09-03 DIAGNOSIS — E6609 Other obesity due to excess calories: Secondary | ICD-10-CM | POA: Diagnosis not present

## 2021-09-03 DIAGNOSIS — G894 Chronic pain syndrome: Secondary | ICD-10-CM | POA: Diagnosis not present

## 2021-10-01 DIAGNOSIS — N400 Enlarged prostate without lower urinary tract symptoms: Secondary | ICD-10-CM | POA: Diagnosis not present

## 2021-10-01 DIAGNOSIS — G894 Chronic pain syndrome: Secondary | ICD-10-CM | POA: Diagnosis not present

## 2021-10-01 DIAGNOSIS — Z6833 Body mass index (BMI) 33.0-33.9, adult: Secondary | ICD-10-CM | POA: Diagnosis not present

## 2021-10-01 DIAGNOSIS — M1991 Primary osteoarthritis, unspecified site: Secondary | ICD-10-CM | POA: Diagnosis not present

## 2021-10-01 DIAGNOSIS — I10 Essential (primary) hypertension: Secondary | ICD-10-CM | POA: Diagnosis not present

## 2021-10-01 DIAGNOSIS — E291 Testicular hypofunction: Secondary | ICD-10-CM | POA: Diagnosis not present

## 2021-11-02 DIAGNOSIS — G894 Chronic pain syndrome: Secondary | ICD-10-CM | POA: Diagnosis not present

## 2021-11-02 DIAGNOSIS — Z6832 Body mass index (BMI) 32.0-32.9, adult: Secondary | ICD-10-CM | POA: Diagnosis not present

## 2021-11-02 DIAGNOSIS — I10 Essential (primary) hypertension: Secondary | ICD-10-CM | POA: Diagnosis not present

## 2021-11-02 DIAGNOSIS — E6609 Other obesity due to excess calories: Secondary | ICD-10-CM | POA: Diagnosis not present

## 2021-11-02 DIAGNOSIS — M1991 Primary osteoarthritis, unspecified site: Secondary | ICD-10-CM | POA: Diagnosis not present

## 2021-12-02 DIAGNOSIS — N400 Enlarged prostate without lower urinary tract symptoms: Secondary | ICD-10-CM | POA: Diagnosis not present

## 2021-12-02 DIAGNOSIS — M1991 Primary osteoarthritis, unspecified site: Secondary | ICD-10-CM | POA: Diagnosis not present

## 2021-12-02 DIAGNOSIS — L409 Psoriasis, unspecified: Secondary | ICD-10-CM | POA: Diagnosis not present

## 2021-12-02 DIAGNOSIS — Z6832 Body mass index (BMI) 32.0-32.9, adult: Secondary | ICD-10-CM | POA: Diagnosis not present

## 2021-12-02 DIAGNOSIS — I10 Essential (primary) hypertension: Secondary | ICD-10-CM | POA: Diagnosis not present

## 2021-12-02 DIAGNOSIS — E291 Testicular hypofunction: Secondary | ICD-10-CM | POA: Diagnosis not present

## 2021-12-02 DIAGNOSIS — E6609 Other obesity due to excess calories: Secondary | ICD-10-CM | POA: Diagnosis not present

## 2021-12-02 DIAGNOSIS — G894 Chronic pain syndrome: Secondary | ICD-10-CM | POA: Diagnosis not present

## 2021-12-31 DIAGNOSIS — I10 Essential (primary) hypertension: Secondary | ICD-10-CM | POA: Diagnosis not present

## 2021-12-31 DIAGNOSIS — Z6832 Body mass index (BMI) 32.0-32.9, adult: Secondary | ICD-10-CM | POA: Diagnosis not present

## 2021-12-31 DIAGNOSIS — E291 Testicular hypofunction: Secondary | ICD-10-CM | POA: Diagnosis not present

## 2021-12-31 DIAGNOSIS — E6609 Other obesity due to excess calories: Secondary | ICD-10-CM | POA: Diagnosis not present

## 2021-12-31 DIAGNOSIS — G894 Chronic pain syndrome: Secondary | ICD-10-CM | POA: Diagnosis not present

## 2021-12-31 DIAGNOSIS — M1991 Primary osteoarthritis, unspecified site: Secondary | ICD-10-CM | POA: Diagnosis not present

## 2022-02-01 DIAGNOSIS — I10 Essential (primary) hypertension: Secondary | ICD-10-CM | POA: Diagnosis not present

## 2022-02-01 DIAGNOSIS — G894 Chronic pain syndrome: Secondary | ICD-10-CM | POA: Diagnosis not present

## 2022-02-01 DIAGNOSIS — E6609 Other obesity due to excess calories: Secondary | ICD-10-CM | POA: Diagnosis not present

## 2022-02-01 DIAGNOSIS — Z6832 Body mass index (BMI) 32.0-32.9, adult: Secondary | ICD-10-CM | POA: Diagnosis not present

## 2022-02-01 DIAGNOSIS — M1991 Primary osteoarthritis, unspecified site: Secondary | ICD-10-CM | POA: Diagnosis not present

## 2022-03-04 DIAGNOSIS — G4762 Sleep related leg cramps: Secondary | ICD-10-CM | POA: Diagnosis not present

## 2022-03-04 DIAGNOSIS — E6609 Other obesity due to excess calories: Secondary | ICD-10-CM | POA: Diagnosis not present

## 2022-03-04 DIAGNOSIS — G894 Chronic pain syndrome: Secondary | ICD-10-CM | POA: Diagnosis not present

## 2022-03-04 DIAGNOSIS — M5412 Radiculopathy, cervical region: Secondary | ICD-10-CM | POA: Diagnosis not present

## 2022-03-04 DIAGNOSIS — Z6832 Body mass index (BMI) 32.0-32.9, adult: Secondary | ICD-10-CM | POA: Diagnosis not present

## 2022-03-04 DIAGNOSIS — I10 Essential (primary) hypertension: Secondary | ICD-10-CM | POA: Diagnosis not present

## 2022-03-04 DIAGNOSIS — M1991 Primary osteoarthritis, unspecified site: Secondary | ICD-10-CM | POA: Diagnosis not present

## 2022-04-01 DIAGNOSIS — L409 Psoriasis, unspecified: Secondary | ICD-10-CM | POA: Diagnosis not present

## 2022-04-01 DIAGNOSIS — M1991 Primary osteoarthritis, unspecified site: Secondary | ICD-10-CM | POA: Diagnosis not present

## 2022-04-01 DIAGNOSIS — Z6832 Body mass index (BMI) 32.0-32.9, adult: Secondary | ICD-10-CM | POA: Diagnosis not present

## 2022-04-01 DIAGNOSIS — M5412 Radiculopathy, cervical region: Secondary | ICD-10-CM | POA: Diagnosis not present

## 2022-04-01 DIAGNOSIS — G894 Chronic pain syndrome: Secondary | ICD-10-CM | POA: Diagnosis not present

## 2022-04-01 DIAGNOSIS — I10 Essential (primary) hypertension: Secondary | ICD-10-CM | POA: Diagnosis not present

## 2022-04-29 DIAGNOSIS — M1991 Primary osteoarthritis, unspecified site: Secondary | ICD-10-CM | POA: Diagnosis not present

## 2022-04-29 DIAGNOSIS — I10 Essential (primary) hypertension: Secondary | ICD-10-CM | POA: Diagnosis not present

## 2022-04-29 DIAGNOSIS — Z6832 Body mass index (BMI) 32.0-32.9, adult: Secondary | ICD-10-CM | POA: Diagnosis not present

## 2022-04-29 DIAGNOSIS — G894 Chronic pain syndrome: Secondary | ICD-10-CM | POA: Diagnosis not present

## 2022-04-29 DIAGNOSIS — M5416 Radiculopathy, lumbar region: Secondary | ICD-10-CM | POA: Diagnosis not present

## 2022-04-29 DIAGNOSIS — L409 Psoriasis, unspecified: Secondary | ICD-10-CM | POA: Diagnosis not present

## 2022-04-29 DIAGNOSIS — E6609 Other obesity due to excess calories: Secondary | ICD-10-CM | POA: Diagnosis not present

## 2022-04-29 DIAGNOSIS — M5412 Radiculopathy, cervical region: Secondary | ICD-10-CM | POA: Diagnosis not present

## 2022-05-17 DIAGNOSIS — I10 Essential (primary) hypertension: Secondary | ICD-10-CM | POA: Diagnosis not present

## 2022-05-17 DIAGNOSIS — M1991 Primary osteoarthritis, unspecified site: Secondary | ICD-10-CM | POA: Diagnosis not present

## 2022-05-17 DIAGNOSIS — E6609 Other obesity due to excess calories: Secondary | ICD-10-CM | POA: Diagnosis not present

## 2022-05-17 DIAGNOSIS — M5412 Radiculopathy, cervical region: Secondary | ICD-10-CM | POA: Diagnosis not present

## 2022-05-17 DIAGNOSIS — G894 Chronic pain syndrome: Secondary | ICD-10-CM | POA: Diagnosis not present

## 2022-05-17 DIAGNOSIS — Z6831 Body mass index (BMI) 31.0-31.9, adult: Secondary | ICD-10-CM | POA: Diagnosis not present

## 2022-06-24 DIAGNOSIS — G894 Chronic pain syndrome: Secondary | ICD-10-CM | POA: Diagnosis not present

## 2022-06-24 DIAGNOSIS — M5412 Radiculopathy, cervical region: Secondary | ICD-10-CM | POA: Diagnosis not present

## 2022-06-24 DIAGNOSIS — M5416 Radiculopathy, lumbar region: Secondary | ICD-10-CM | POA: Diagnosis not present

## 2022-06-24 DIAGNOSIS — M1991 Primary osteoarthritis, unspecified site: Secondary | ICD-10-CM | POA: Diagnosis not present

## 2022-06-24 DIAGNOSIS — I10 Essential (primary) hypertension: Secondary | ICD-10-CM | POA: Diagnosis not present

## 2022-06-24 DIAGNOSIS — I2089 Other forms of angina pectoris: Secondary | ICD-10-CM | POA: Diagnosis not present

## 2022-06-24 DIAGNOSIS — Z6831 Body mass index (BMI) 31.0-31.9, adult: Secondary | ICD-10-CM | POA: Diagnosis not present

## 2022-06-24 DIAGNOSIS — E6609 Other obesity due to excess calories: Secondary | ICD-10-CM | POA: Diagnosis not present

## 2022-07-23 DIAGNOSIS — M1991 Primary osteoarthritis, unspecified site: Secondary | ICD-10-CM | POA: Diagnosis not present

## 2022-07-23 DIAGNOSIS — E6609 Other obesity due to excess calories: Secondary | ICD-10-CM | POA: Diagnosis not present

## 2022-07-23 DIAGNOSIS — M5416 Radiculopathy, lumbar region: Secondary | ICD-10-CM | POA: Diagnosis not present

## 2022-07-23 DIAGNOSIS — Z6831 Body mass index (BMI) 31.0-31.9, adult: Secondary | ICD-10-CM | POA: Diagnosis not present

## 2022-07-23 DIAGNOSIS — I2089 Other forms of angina pectoris: Secondary | ICD-10-CM | POA: Diagnosis not present

## 2022-07-23 DIAGNOSIS — I10 Essential (primary) hypertension: Secondary | ICD-10-CM | POA: Diagnosis not present

## 2022-07-23 DIAGNOSIS — G894 Chronic pain syndrome: Secondary | ICD-10-CM | POA: Diagnosis not present

## 2022-07-23 DIAGNOSIS — M5412 Radiculopathy, cervical region: Secondary | ICD-10-CM | POA: Diagnosis not present

## 2022-08-05 ENCOUNTER — Ambulatory Visit: Payer: BC Managed Care – PPO | Attending: Internal Medicine | Admitting: Internal Medicine

## 2022-08-05 ENCOUNTER — Encounter: Payer: Self-pay | Admitting: Internal Medicine

## 2022-08-05 VITALS — BP 138/76 | HR 65 | Ht 72.0 in | Wt 229.0 lb

## 2022-08-05 DIAGNOSIS — R252 Cramp and spasm: Secondary | ICD-10-CM

## 2022-08-05 DIAGNOSIS — R079 Chest pain, unspecified: Secondary | ICD-10-CM | POA: Insufficient documentation

## 2022-08-05 DIAGNOSIS — I10 Essential (primary) hypertension: Secondary | ICD-10-CM | POA: Diagnosis not present

## 2022-08-05 DIAGNOSIS — M79605 Pain in left leg: Secondary | ICD-10-CM

## 2022-08-05 DIAGNOSIS — I209 Angina pectoris, unspecified: Secondary | ICD-10-CM

## 2022-08-05 DIAGNOSIS — Z720 Tobacco use: Secondary | ICD-10-CM

## 2022-08-05 DIAGNOSIS — M79604 Pain in right leg: Secondary | ICD-10-CM | POA: Insufficient documentation

## 2022-08-05 MED ORDER — NITROGLYCERIN 0.4 MG SL SUBL: 0.4000 mg | SUBLINGUAL_TABLET | SUBLINGUAL | 3 refills | Status: AC | PRN

## 2022-08-05 MED ORDER — METOPROLOL TARTRATE 25 MG PO TABS: 12.5000 mg | ORAL_TABLET | Freq: Two times a day (BID) | ORAL | 3 refills | Status: DC

## 2022-08-05 NOTE — Patient Instructions (Signed)
Medication Instructions:  Your physician has recommended you make the following change in your medication:  - Stop Diltiazem - Start Metoprolol Tartrate 12.5 mg tablets twice daily.    Labwork: Lipid- Nothing to eat/drink at least 6 hours prior to lab work  Testing/Procedures: Your physician has requested that you have an echocardiogram. Echocardiography is a painless test that uses sound waves to create images of your heart. It provides your doctor with information about the size and shape of your heart and how well your heart's chambers and valves are working. This procedure takes approximately one hour. There are no restrictions for this procedure. Please do NOT wear cologne, perfume, aftershave, or lotions (deodorant is allowed). Please arrive 15 minutes prior to your appointment time.  Your physician has requested that you have a exercise myoview. For further information please visit [email or web address]. Please follow instruction sheet, as given.  Your physician has requested that you have an ankle brachial index (ABI). During this test an ultrasound and blood pressure cuff are used to evaluate the arteries that supply the arms and legs with blood. Allow thirty minutes for this exam. There are no restrictions or special instructions.    Follow-Up: Follow up with Dr. Dellia Cloud in 1 month.   Any Other Special Instructions Will Be Listed Below (If Applicable).     If you need a refill on your cardiac medications before your next appointment, please call your pharmacy.

## 2022-08-05 NOTE — Progress Notes (Signed)
Cardiology Office Note  Date: 08/05/2022   ID: Alan Grant, DOB [birth year], MRN [id number]  PCP:  Redmond School, MD  Cardiologist:  None Electrophysiologist:  None   Reason for Office Visit: Chest tightness evaluation at the request of Dr. Riley Kill   History of Present Illness: Alan Grant is a 67 y.o. male known to have HTN, nicotine abuse was referred to cardiology clinic for evaluation of chest tightness.  Patient started to have substernal chest tightness rating to left arm x 7 months, lasting for 5 to 10 minutes with continued lingering chest tightness into 1 hour, occurs with exertion (stacking wood, walking, exposure to cold weather) and resolves with rest. Does not have SL NTG to take. Denies any DOE, fatigue, LE swelling, palpitations, dizziness, syncope.  He reported having pain no cramps in his bilateral lower extremities during walking that he had to stop walking for relief. he does have a family history of CAD but not premature. He smokes cigarettes, 1 pack/day. He quit for 10 years in the past but currently is trying to quit.  Patient also underwent LHC in 2005 and was told he had no blockages.  Past Medical History:  Diagnosis Date   Hypertension     Past Surgical History:  Procedure Laterality Date   cardiac catherization     COLONOSCOPY WITH PROPOFOL N/A 08/17/2018   Procedure: COLONOSCOPY WITH PROPOFOL;  Surgeon: Daneil Dolin, MD;  Location: [facility];  Service: Endoscopy;  Laterality: N/A;  10:45am   POLYPECTOMY  08/17/2018   Procedure: POLYPECTOMY;  Surgeon: Daneil Dolin, MD;  Location: [facility];  Service: Endoscopy;;  ileo-cecal valve (CSX2)/descending colon(CSx1)    Current Outpatient Medications  Medication Sig Dispense Refill   amLODipine (NORVASC) 5 MG tablet Take 5 mg by mouth daily.     diltiazem (TIAZAC) 360 MG 24 hr capsule Take 360 mg by mouth daily.     gabapentin (NEURONTIN) 100 MG capsule Take 100 mg by mouth 3 (three) times  daily.     HYDROcodone-acetaminophen (NORCO) 10-325 MG tablet Take 1 tablet by mouth every 6 (six) hours as needed for moderate pain.     lisinopril-hydrochlorothiazide (ZESTORETIC) 10-12.5 MG tablet Take 1 tablet by mouth daily.     morphine (MS CONTIN) 30 MG 12 hr tablet SMARTSIG:1 Tablet(s) By Mouth Every 12 Hours     tamsulosin (FLOMAX) 0.4 MG CAPS capsule Take 0.4 mg by mouth daily.      testosterone cypionate (DEPOTESTOSTERONE CYPIONATE) 200 MG/ML injection Inject 200 mg into the muscle every 14 (fourteen) days.  (Patient not taking: Reported on 08/05/2022)     No current facility-administered medications for this visit.   Allergies:  Patient has no known allergies.   Social History: The patient  reports that he has been smoking cigarettes. He has a 40.00 pack-year smoking history. He uses smokeless tobacco. He reports that he does not drink alcohol and does not use drugs.   Family History: The patient's family history includes Diabetes in his father; Heart failure in his father; Hypertension in his father.   ROS:  Please see the history of present illness. Otherwise, complete review of systems is positive for none.  All other systems are reviewed and negative.   Physical Exam: VS:  BP 138/76   Pulse 65   Ht 6' (1.829 m)   Wt 229 lb (103.9 kg)   SpO2 98%   BMI 31.06 kg/m , BMI Body mass index is 31.06 kg/m.  Wt Readings from Last 3 Encounters:  08/05/22 229 lb (103.9 kg)  08/11/18 231 lb 6.4 oz (105 kg)  07/20/18 231 lb (104.8 kg)    General: Patient appears comfortable at rest. HEENT: Conjunctiva and lids normal, oropharynx clear with moist mucosa. Neck: Supple, no elevated JVP or carotid bruits, no thyromegaly. Lungs: Clear to auscultation, nonlabored breathing at rest. Cardiac: Holosystolic murmur grade 3/6 Abdomen: Soft, nontender, no hepatomegaly, bowel sounds present, no guarding or rebound. Extremities: No pitting edema Skin: Warm and dry. Musculoskeletal: No  kyphosis. Neuropsychiatric: Alert and oriented x3, affect grossly appropriate.  ECG:  An ECG dated 08/05/2022 was personally reviewed today and demonstrated:  Normal sinus rhythm and no ST changes  Recent Labwork: No results found for requested labs within last 365 days.  No results found for: "CHOL", "TRIG", "HDL", "CHOLHDL", "VLDL", "LDLCALC", "LDLDIRECT"  Other Studies Reviewed Today: I personally reviewed the heart care referral records from the PCP  Assessment and Plan: Patient is a 67 year old M known to have HTN, nicotine abuse was referred to cardiology clinic for evaluation chest tightness.  # Cardiac chest pain -Patient started to have substernal chest tightness radiating to left arm x 7 months, lasting for 5 to 10 minutes with continued lingering chest tightness into 1 hour, occurs with exertion (stacking wood, walking, exposure to cold weather) and resolves with rest. He will benefit from treadmill exercise Myoview. If he cannot exercise due to claudication, can switch to [place] 2D echocardiogram -Continue aspirin 81 mg once daily -Start metoprolol tartrate 12.5 mg twice daily (discontinue diltiazem) -SL NTG 0.4 mg as needed -ER precautions for chest pain -Obtain lipid panel  # Bilateral lower extremity claudication, rule out PAD -Obtain ABI with USG arterial Doppler lower extremities  # Nicotine abuse -Currently smokes 1 pack/day. Not interested to be referred to smoking cessation program. Smoking cessation instruction/counseling given:  counseled patient on the dangers of tobacco use, advised patient to stop smoking, and reviewed strategies to maximize success   # HTN, controlled -Continue amlodipine 5 mg once daily -Start metoprolol tartrate 12.5 mg twice daily (started for angina) -Discontinue diltiazem -Continue lisinopril-HCTZ 10-12.5 mg once daily -HTN management per PCP  I have spent a total of 45 minutes with patient reviewing chart, EKGs, labs and  examining patient as well as establishing an assessment and plan that was discussed with the patient.  > 50% of time was spent in direct patient care.   '  Medication Adjustments/Labs and Tests Ordered: Current medicines are reviewed at length with the patient today.  Concerns regarding medicines are outlined above.   Tests Ordered: Orders Placed This Encounter  Procedures   EKG 12-Lead    Medication Changes: No orders of the defined types were placed in this encounter.   Disposition:  Follow up  1 month or sooner based on abnormal results  Signed, Tadhg Eskew Fidel Levy, MD, 08/05/2022 8:36 AM    [facility] [address] [place], [place] [address]

## 2022-08-09 ENCOUNTER — Ambulatory Visit (HOSPITAL_COMMUNITY)
Admission: RE | Admit: 2022-08-09 | Discharge: 2022-08-09 | Disposition: A | Discharge status: Home / Self Care | Payer: BC Managed Care – PPO | Source: Ambulatory Visit | Attending: Internal Medicine | Admitting: Internal Medicine

## 2022-08-09 ENCOUNTER — Encounter (HOSPITAL_COMMUNITY)
Admission: RE | Admit: 2022-08-09 | Discharge: 2022-08-09 | Disposition: A | Discharge status: Home / Self Care | Payer: BC Managed Care – PPO | Source: Ambulatory Visit | Attending: Internal Medicine | Admitting: Internal Medicine

## 2022-08-09 DIAGNOSIS — R079 Chest pain, unspecified: Secondary | ICD-10-CM | POA: Diagnosis not present

## 2022-08-09 DIAGNOSIS — I209 Angina pectoris, unspecified: Secondary | ICD-10-CM | POA: Diagnosis not present

## 2022-08-09 LAB — NM MYOCAR MULTI W/SPECT W/WALL MOTION / EF
LV dias vol: 114 mL (ref 62–150)
LV sys vol: 41 mL
Nuc Stress EF: 64 %
Peak HR: 94 {beats}/min
RATE: 0.2
Rest HR: 68 {beats}/min
Rest Nuclear Isotope Dose: 10.4 mCi
SDS: 0
SRS: 2
SSS: 2
ST Depression (mm): 0 mm
Stress Nuclear Isotope Dose: 30 mCi

## 2022-08-09 MED ORDER — SODIUM CHLORIDE FLUSH 0.9 % IV SOLN
INTRAVENOUS | Status: AC
  Administered 2022-08-09: 10 mL via INTRAVENOUS
  Filled 2022-08-09: qty 10

## 2022-08-09 MED ORDER — TECHNETIUM TC 99M TETROFOSMIN IV KIT
30.0000 | PACK | Freq: Once | INTRAVENOUS | Status: AC | PRN
  Administered 2022-08-09: 30 via INTRAVENOUS

## 2022-08-09 MED ORDER — TECHNETIUM TC 99M TETROFOSMIN IV KIT
10.0000 | PACK | Freq: Once | INTRAVENOUS | Status: AC | PRN
  Administered 2022-08-09: 10.38 via INTRAVENOUS

## 2022-08-09 MED ORDER — REGADENOSON 0.4 MG/5ML IV SOLN
INTRAVENOUS | Status: AC
  Administered 2022-08-09: 0.4 mg via INTRAVENOUS
  Filled 2022-08-09: qty 5

## 2022-08-13 ENCOUNTER — Telehealth: Payer: Self-pay

## 2022-08-13 NOTE — Telephone Encounter (Signed)
-----   Message from Chalmers Guest, MD sent at 08/13/2022  7:50 AM EST ----- Normal stress test.

## 2022-08-13 NOTE — Telephone Encounter (Signed)
Patient notified and verbalized understanding. Patient had no questions or concerns at this time. PCP copied 

## 2022-08-19 DIAGNOSIS — M5412 Radiculopathy, cervical region: Secondary | ICD-10-CM | POA: Diagnosis not present

## 2022-08-19 DIAGNOSIS — I10 Essential (primary) hypertension: Secondary | ICD-10-CM | POA: Diagnosis not present

## 2022-08-19 DIAGNOSIS — G894 Chronic pain syndrome: Secondary | ICD-10-CM | POA: Diagnosis not present

## 2022-08-19 DIAGNOSIS — M1991 Primary osteoarthritis, unspecified site: Secondary | ICD-10-CM | POA: Diagnosis not present

## 2022-08-19 DIAGNOSIS — Z6830 Body mass index (BMI) 30.0-30.9, adult: Secondary | ICD-10-CM | POA: Diagnosis not present

## 2022-08-19 DIAGNOSIS — M5416 Radiculopathy, lumbar region: Secondary | ICD-10-CM | POA: Diagnosis not present

## 2022-08-19 DIAGNOSIS — K224 Dyskinesia of esophagus: Secondary | ICD-10-CM | POA: Diagnosis not present

## 2022-08-19 DIAGNOSIS — E6609 Other obesity due to excess calories: Secondary | ICD-10-CM | POA: Diagnosis not present

## 2022-08-27 ENCOUNTER — Ambulatory Visit (HOSPITAL_COMMUNITY): Admission: RE | Admit: 2022-08-27 | Payer: BC Managed Care – PPO | Source: Ambulatory Visit

## 2022-08-27 ENCOUNTER — Ambulatory Visit (HOSPITAL_COMMUNITY): Payer: No Typology Code available for payment source

## 2022-09-06 ENCOUNTER — Ambulatory Visit: Payer: No Typology Code available for payment source | Admitting: Internal Medicine

## 2022-09-16 DIAGNOSIS — I10 Essential (primary) hypertension: Secondary | ICD-10-CM | POA: Diagnosis not present

## 2022-09-16 DIAGNOSIS — Z6830 Body mass index (BMI) 30.0-30.9, adult: Secondary | ICD-10-CM | POA: Diagnosis not present

## 2022-09-16 DIAGNOSIS — G894 Chronic pain syndrome: Secondary | ICD-10-CM | POA: Diagnosis not present

## 2022-09-16 DIAGNOSIS — M1991 Primary osteoarthritis, unspecified site: Secondary | ICD-10-CM | POA: Diagnosis not present

## 2022-09-16 DIAGNOSIS — M5416 Radiculopathy, lumbar region: Secondary | ICD-10-CM | POA: Diagnosis not present

## 2022-09-16 DIAGNOSIS — M5412 Radiculopathy, cervical region: Secondary | ICD-10-CM | POA: Diagnosis not present

## 2022-09-16 DIAGNOSIS — E6609 Other obesity due to excess calories: Secondary | ICD-10-CM | POA: Diagnosis not present

## 2022-09-16 DIAGNOSIS — K219 Gastro-esophageal reflux disease without esophagitis: Secondary | ICD-10-CM | POA: Diagnosis not present

## 2022-09-20 ENCOUNTER — Encounter: Payer: Self-pay | Admitting: Internal Medicine

## 2023-07-07 ENCOUNTER — Encounter: Payer: Self-pay | Admitting: *Deleted

## 2023-09-13 ENCOUNTER — Ambulatory Visit: Attending: Internal Medicine | Admitting: Internal Medicine

## 2023-09-13 ENCOUNTER — Telehealth: Payer: Self-pay | Admitting: Internal Medicine

## 2023-09-13 ENCOUNTER — Encounter: Payer: Self-pay | Admitting: Internal Medicine

## 2023-09-13 VITALS — BP 118/68 | HR 69 | Ht 72.0 in | Wt 226.2 lb

## 2023-09-13 DIAGNOSIS — Z01810 Encounter for preprocedural cardiovascular examination: Secondary | ICD-10-CM | POA: Insufficient documentation

## 2023-09-13 DIAGNOSIS — Z136 Encounter for screening for cardiovascular disorders: Secondary | ICD-10-CM | POA: Insufficient documentation

## 2023-09-13 DIAGNOSIS — R079 Chest pain, unspecified: Secondary | ICD-10-CM | POA: Diagnosis not present

## 2023-09-13 MED ORDER — RANOLAZINE ER 500 MG PO TB12: 500.0000 mg | ORAL_TABLET | Freq: Two times a day (BID) | ORAL | 5 refills | Status: DC

## 2023-09-13 NOTE — Telephone Encounter (Signed)
 Checking percert on the following patient for testing scheduled   LHC on 09/19/2023 with Dr. Jacinto Halim at 7:30

## 2023-09-13 NOTE — Progress Notes (Unsigned)
 Cardiology Office Note  Date: 09/13/2023   ID: DELOYD Grant, DOB [birth year], MRN [id number]  PCP:  Alan Nevins, MD  Cardiologist:  Alan Bicker, MD Electrophysiologist:  None    History of Present Illness: Alan Grant is a 68 y.o. male known to have HTN, nicotine abuse was referred to cardiology clinic for evaluation of chest tightness.  Patient started to have substernal chest tightness rating to left arm x 7 months, lasting for 5 to 10 minutes with continued lingering chest tightness into 1 hour, occurs with exertion (stacking wood, walking, exposure to cold weather) and resolves with rest. Does not have SL NTG to take. Denies any DOE, fatigue, LE swelling, palpitations, dizziness, syncope.  He reported having pain no cramps in his bilateral lower extremities during walking that he had to stop walking for relief. he does have a family history of CAD but not premature. He smokes cigarettes, 1 pack/day. He quit for 10 years in the past but currently is trying to quit.  Patient also underwent LHC in 2005 and was told he had no blockages.  Past Medical History:  Diagnosis Date   Hypertension     Past Surgical History:  Procedure Laterality Date   cardiac catherization     COLONOSCOPY WITH PROPOFOL N/A 08/17/2018   Procedure: COLONOSCOPY WITH PROPOFOL;  Surgeon: Alan Ade, MD;  [facility];  Service: Endoscopy;  Laterality: N/A;  10:45am   POLYPECTOMY  08/17/2018   Procedure: POLYPECTOMY;  Surgeon: Alan Ade, MD;  [facility];  Service: Endoscopy;;  ileo-cecal valve (CSX2)/descending colon(CSx1)    Current Outpatient Medications  Medication Sig Dispense Refill   amLODipine (NORVASC) 10 MG tablet Take 10 mg by mouth daily.     aspirin EC 81 MG tablet Take 81 mg by mouth daily. Swallow whole.     gabapentin (NEURONTIN) 100 MG capsule Take 100 mg by mouth 3 (three) times daily.     HYDROcodone-acetaminophen (NORCO) 10-325 MG tablet Take  1 tablet by mouth every 6 (six) hours as needed for moderate pain.     lisinopril-hydrochlorothiazide (ZESTORETIC) 10-12.5 MG tablet Take 1 tablet by mouth daily.     morphine (MS CONTIN) 30 MG 12 hr tablet SMARTSIG:1 Tablet(s) By Mouth Every 12 Hours     nitroGLYCERIN (NITROSTAT) 0.4 MG SL tablet Place 1 tablet (0.4 mg total) under the tongue every 5 (five) minutes as needed for chest pain. If a single episode of chest pain is not relieved by one tablet, the patient will try another within 5 minutes; and if this doesn't relieve the pain, the patient is instructed to call 911 for transportation to an emergency department. 25 tablet 3   pantoprazole (PROTONIX) 40 MG tablet Take 40 mg by mouth daily.     tamsulosin (FLOMAX) 0.4 MG CAPS capsule Take 0.4 mg by mouth daily.      TIADYLT ER 360 MG 24 hr capsule Take 360 mg by mouth daily.     tiZANidine (ZANAFLEX) 4 MG tablet Take 4 mg by mouth every 6 (six) hours.     Vitamin D, Ergocalciferol, (DRISDOL) 1.25 MG (50000 UNIT) CAPS capsule Take 50,000 Units by mouth once a week.     No current facility-administered medications for this visit.   Allergies:  Patient has no known allergies.   Social History: The patient  reports that he quit smoking about 2 months ago. His smoking use included cigarettes. He has a 40 pack-year smoking history.  He uses smokeless tobacco. He reports that he does not drink alcohol and does not use drugs.   Family History: The patient's family history includes Diabetes in his father; Heart failure in his father; Hypertension in his father.   ROS:  Please see the history of present illness. Otherwise, complete review of systems is positive for none.  All other systems are reviewed and negative.   Physical Exam: VS:  BP 118/68   Pulse 69   Ht 6' (1.829 m)   Wt 226 lb 3.2 oz (102.6 kg)   SpO2 96%   BMI 30.68 kg/m , BMI Body mass index is 30.68 kg/m.  Wt Readings from Last 3 Encounters:  09/13/23 226 lb 3.2 oz (102.6  kg)  08/05/22 229 lb (103.9 kg)  08/11/18 231 lb 6.4 oz (105 kg)    General: Patient appears comfortable at rest. HEENT: Conjunctiva and lids normal, oropharynx clear with moist mucosa. Neck: Supple, no elevated JVP or carotid bruits, no thyromegaly. Lungs: Clear to auscultation, nonlabored breathing at rest. Cardiac: Holosystolic murmur grade 3/6 Abdomen: Soft, nontender, no hepatomegaly, bowel sounds present, no guarding or rebound. Extremities: No pitting edema Skin: Warm and dry. Musculoskeletal: No kyphosis. Neuropsychiatric: Alert and oriented x3, affect grossly appropriate.  ECG:  An ECG dated 08/05/2022 was personally reviewed today and demonstrated:  Normal sinus rhythm and no ST changes  Recent Labwork: No results found for requested labs within last 365 days.  No results found for: "CHOL", "TRIG", "HDL", "CHOLHDL", "VLDL", "LDLCALC", "LDLDIRECT"  Other Studies Reviewed Today: I personally reviewed the heart care referral records from the PCP  Assessment and Plan: Patient is a 68 year old M known to have HTN, nicotine abuse was referred to cardiology clinic for evaluation chest tightness.  # Cardiac chest pain -Patient started to have substernal chest tightness radiating to left arm x 7 months, lasting for 5 to 10 minutes with continued lingering chest tightness into 1 hour, occurs with exertion (stacking wood, walking, exposure to cold weather) and resolves with rest. He will benefit from treadmill exercise Myoview. If he cannot exercise due to claudication, can switch to [place]. -Obtain 2D echocardiogram -Continue aspirin 81 mg once daily -Start metoprolol tartrate 12.5 mg twice daily (discontinue diltiazem) -SL NTG 0.4 mg as needed -ER precautions for chest pain -Obtain lipid panel  # Bilateral lower extremity claudication, rule out PAD -Obtain ABI with USG arterial Doppler lower extremities  # Nicotine abuse -Currently smokes 1 pack/day. Not interested to be  referred to smoking cessation program. Smoking cessation instruction/counseling given:  counseled patient on the dangers of tobacco use, advised patient to stop smoking, and reviewed strategies to maximize success   # HTN, controlled -Continue amlodipine 5 mg once daily -Start metoprolol tartrate 12.5 mg twice daily (started for angina) -Discontinue diltiazem -Continue lisinopril-HCTZ 10-12.5 mg once daily -HTN management per PCP  I have spent a total of 45 minutes with patient reviewing chart, EKGs, labs and examining patient as well as establishing an assessment and plan that was discussed with the patient.  > 50% of time was spent in direct patient care.   '  Medication Adjustments/Labs and Tests Ordered: Current medicines are reviewed at length with the patient today.  Concerns regarding medicines are outlined above.   Tests Ordered: Orders Placed This Encounter  Procedures   EKG 12-Lead    Medication Changes: No orders of the defined types were placed in this encounter.   Disposition:  Follow up  1 month or sooner based  on abnormal results  Signed, Tacara Hadlock Verne Spurr, MD, 09/13/2023 1:13 PM    [facility] at [facility] [address] [place], [place] [address]

## 2023-09-13 NOTE — H&P (View-Only) (Signed)
 Cardiology Office Note  Date: 09/13/2023   ID: [name], DOB [birth year], MRN [id number]  PCP:  Elfredia Nevins, MD  Cardiologist:  Marjo Bicker, MD Electrophysiologist:  None    History of Present Illness: Alan Grant is a 68 y.o. male known to have HTN, nicotine abuse was referred to cardiology clinic for evaluation of chest tightness.  Patient started to have substernal chest tightness rating to left arm x 7 months, lasting for 5 to 10 minutes with continued lingering chest tightness into 1 hour, occurs with exertion (stacking wood, walking, exposure to cold weather) and resolves with rest. Does not have SL NTG to take. Denies any DOE, fatigue, LE swelling, palpitations, dizziness, syncope.  He reported having pain no cramps in his bilateral lower extremities during walking that he had to stop walking for relief. he does have a family history of CAD but not premature. He smokes cigarettes, 1 pack/day. He quit for 10 years in the past but currently is trying to quit.  Patient also underwent LHC in 2005 and was told he had no blockages.  Past Medical History:  Diagnosis Date   Hypertension     Past Surgical History:  Procedure Laterality Date   cardiac catherization     COLONOSCOPY WITH PROPOFOL N/A 08/17/2018   Procedure: COLONOSCOPY WITH PROPOFOL;  Surgeon: Corbin Ade, MD;  [facility];  Service: Endoscopy;  Laterality: N/A;  10:45am   POLYPECTOMY  08/17/2018   Procedure: POLYPECTOMY;  Surgeon: Corbin Ade, MD;  [facility];  Service: Endoscopy;;  ileo-cecal valve (CSX2)/descending colon(CSx1)    Current Outpatient Medications  Medication Sig Dispense Refill   amLODipine (NORVASC) 10 MG tablet Take 10 mg by mouth daily.     aspirin EC 81 MG tablet Take 81 mg by mouth daily. Swallow whole.     gabapentin (NEURONTIN) 100 MG capsule Take 100 mg by mouth 3 (three) times daily.     HYDROcodone-acetaminophen (NORCO) 10-325 MG tablet Take  1 tablet by mouth every 6 (six) hours as needed for moderate pain.     lisinopril-hydrochlorothiazide (ZESTORETIC) 10-12.5 MG tablet Take 1 tablet by mouth daily.     morphine (MS CONTIN) 30 MG 12 hr tablet SMARTSIG:1 Tablet(s) By Mouth Every 12 Hours     nitroGLYCERIN (NITROSTAT) 0.4 MG SL tablet Place 1 tablet (0.4 mg total) under the tongue every 5 (five) minutes as needed for chest pain. If a single episode of chest pain is not relieved by one tablet, the patient will try another within 5 minutes; and if this doesn't relieve the pain, the patient is instructed to call 911 for transportation to an emergency department. 25 tablet 3   pantoprazole (PROTONIX) 40 MG tablet Take 40 mg by mouth daily.     tamsulosin (FLOMAX) 0.4 MG CAPS capsule Take 0.4 mg by mouth daily.      TIADYLT ER 360 MG 24 hr capsule Take 360 mg by mouth daily.     tiZANidine (ZANAFLEX) 4 MG tablet Take 4 mg by mouth every 6 (six) hours.     Vitamin D, Ergocalciferol, (DRISDOL) 1.25 MG (50000 UNIT) CAPS capsule Take 50,000 Units by mouth once a week.     No current facility-administered medications for this visit.   Allergies:  Patient has no known allergies.   Social History: The patient  reports that he quit smoking about 2 months ago. His smoking use included cigarettes. He has a 40 pack-year smoking history.  He uses smokeless tobacco. He reports that he does not drink alcohol and does not use drugs.   Family History: The patient's family history includes Diabetes in his father; Heart failure in his father; Hypertension in his father.   ROS:  Please see the history of present illness. Otherwise, complete review of systems is positive for none.  All other systems are reviewed and negative.   Physical Exam: VS:  BP 118/68   Pulse 69   Ht 6' (1.829 m)   Wt 226 lb 3.2 oz (102.6 kg)   SpO2 96%   BMI 30.68 kg/m , BMI Body mass index is 30.68 kg/m.  Wt Readings from Last 3 Encounters:  09/13/23 226 lb 3.2 oz (102.6  kg)  08/05/22 229 lb (103.9 kg)  08/11/18 231 lb 6.4 oz (105 kg)    General: Patient appears comfortable at rest. HEENT: Conjunctiva and lids normal, oropharynx clear with moist mucosa. Neck: Supple, no elevated JVP or carotid bruits, no thyromegaly. Lungs: Clear to auscultation, nonlabored breathing at rest. Cardiac: Holosystolic murmur grade 3/6 Abdomen: Soft, nontender, no hepatomegaly, bowel sounds present, no guarding or rebound. Extremities: No pitting edema Skin: Warm and dry. Musculoskeletal: No kyphosis. Neuropsychiatric: Alert and oriented x3, affect grossly appropriate.  ECG:  An ECG dated 08/05/2022 was personally reviewed today and demonstrated:  Normal sinus rhythm and no ST changes  Recent Labwork: No results found for requested labs within last 365 days.  No results found for: "CHOL", "TRIG", "HDL", "CHOLHDL", "VLDL", "LDLCALC", "LDLDIRECT"  Other Studies Reviewed Today: I personally reviewed the heart care referral records from the PCP  Assessment and Plan: Patient is a 68 year old M known to have HTN, nicotine abuse was referred to cardiology clinic for evaluation chest tightness.  # Cardiac chest pain -Patient started to have substernal chest tightness radiating to left arm x 7 months, lasting for 5 to 10 minutes with continued lingering chest tightness into 1 hour, occurs with exertion (stacking wood, walking, exposure to cold weather) and resolves with rest. He will benefit from treadmill exercise Myoview. If he cannot exercise due to claudication, can switch to [place]. -Obtain 2D echocardiogram -Continue aspirin 81 mg once daily -Start metoprolol tartrate 12.5 mg twice daily (discontinue diltiazem) -SL NTG 0.4 mg as needed -ER precautions for chest pain -Obtain lipid panel  # Bilateral lower extremity claudication, rule out PAD -Obtain ABI with USG arterial Doppler lower extremities  # Nicotine abuse -Currently smokes 1 pack/day. Not interested to be  referred to smoking cessation program. Smoking cessation instruction/counseling given:  counseled patient on the dangers of tobacco use, advised patient to stop smoking, and reviewed strategies to maximize success   # HTN, controlled -Continue amlodipine 5 mg once daily -Start metoprolol tartrate 12.5 mg twice daily (started for angina) -Discontinue diltiazem -Continue lisinopril-HCTZ 10-12.5 mg once daily -HTN management per PCP  I have spent a total of 45 minutes with patient reviewing chart, EKGs, labs and examining patient as well as establishing an assessment and plan that was discussed with the patient.  > 50% of time was spent in direct patient care.   '  Medication Adjustments/Labs and Tests Ordered: Current medicines are reviewed at length with the patient today.  Concerns regarding medicines are outlined above.   Tests Ordered: Orders Placed This Encounter  Procedures   EKG 12-Lead    Medication Changes: No orders of the defined types were placed in this encounter.   Disposition:  Follow up  1 month or sooner based  on abnormal results  Signed, Tacara Hadlock Verne Spurr, MD, 09/13/2023 1:13 PM    [facility] at [facility] [address] [place], [place] [address]

## 2023-09-13 NOTE — Patient Instructions (Signed)
 Medication Instructions:  Your physician has recommended you make the following change in your medication:  Start Ranolazine 500 mg twice daily Continue all other medications as prescribed  Labwork: CBC and BMET today/tomorrow at [facility]  Testing/Procedures:  [facility] [place] [facility] DEPT OF [facility] [address] [place] [address] Dept: [phone] Loc: [phone]  VARICK KEYS  09/13/2023  You are scheduled for a Cardiac Catheterization on Monday, March 24 with Dr.  Jacinto Halim .  1. Please arrive at the [facility] (Main Entrance A) at [facility]: [address] [place], [place] [address] at 7:30 AM (This time is 2 hour(s) before your procedure to ensure your preparation).   Free valet parking service is available. You will check in at ADMITTING. The support person will be asked to wait in the waiting room.  It is OK to have someone drop you off and come back when you are ready to be discharged.    Special note: Every effort is made to have your procedure done on time. Please understand that emergencies sometimes delay scheduled procedures.  2. Diet: Do not eat solid foods after midnight.  The patient may have clear liquids until 5am upon the day of the procedure.  3. Labs: You will need to have blood drawn on Tuesday, March 24 at [facility] Entrance, Go to 1st desk on your right to register.  Address: [address] [place], [place] [address]  Open: 8am - 5pm  Phone: [phone]. You do not need to be fasting.  4. Medication instructions in preparation for your procedure:   Contrast Allergy: No  Stop taking, Ranolazine  and Lisinopril-Hydrochlorothiazide  Sunday, March 23,   On the morning of your procedure, take your Aspirin 81 mg and any morning medicines NOT listed above.  You may use sips of water.  5. Plan to go home the same day, you will only stay overnight if medically necessary. 6.  Bring a current list of your medications and current insurance cards. 7. You MUST have a responsible person to drive you home. 8. Someone MUST be with you the first 24 hours after you arrive home or your discharge will be delayed. 9. Please wear clothes that are easy to get on and off and wear slip-on shoes.  Thank you for allowing [place] to care for you!   -- [facility]  Your physician has requested that you have an echocardiogram. Echocardiography is a painless test that uses sound waves to create images of your heart. It provides your doctor with information about the size and shape of your heart and how well your heart's chambers and valves are working. This procedure takes approximately one hour. There are no restrictions for this procedure. Please do NOT wear cologne, perfume, aftershave, or lotions (deodorant is allowed). Please arrive 15 minutes prior to your appointment time.  Please note: We ask at that you not bring children with you during ultrasound (echo/ vascular) testing. Due to room size and safety concerns, children are not allowed in the ultrasound rooms during exams. Our front office staff cannot provide observation of children in our lobby area while testing is being conducted. An adult accompanying a patient to their appointment will only be allowed in the ultrasound room at the discretion of the ultrasound technician under special circumstances. We apologize for any inconvenience.   Follow-Up: Your physician recommends that you schedule a follow-up  appointment in: 1 month after LHC  Any Other Special Instructions Will Be Listed Below (If Applicable). Thank you for choosing [facility]!      If you need a refill on your cardiac medications before your next appointment, please call your pharmacy.

## 2023-09-15 ENCOUNTER — Telehealth: Payer: Self-pay | Admitting: *Deleted

## 2023-09-15 LAB — CBC
Hematocrit: 43.7 % (ref 37.5–51.0)
Hemoglobin: 14.3 g/dL (ref 13.0–17.7)
MCH: 28.8 pg (ref 26.6–33.0)
MCHC: 32.7 g/dL (ref 31.5–35.7)
MCV: 88 fL (ref 79–97)
Platelets: 243 10*3/uL (ref 150–450)
RBC: 4.97 x10E6/uL (ref 4.14–5.80)
RDW: 13.1 % (ref 11.6–15.4)
WBC: 8 10*3/uL (ref 3.4–10.8)

## 2023-09-15 LAB — BASIC METABOLIC PANEL
BUN/Creatinine Ratio: 12 (ref 10–24)
BUN: 11 mg/dL (ref 8–27)
CO2: 25 mmol/L (ref 20–29)
Calcium: 9.4 mg/dL (ref 8.6–10.2)
Chloride: 100 mmol/L (ref 96–106)
Creatinine, Ser: 0.91 mg/dL (ref 0.76–1.27)
Glucose: 112 mg/dL — ABNORMAL HIGH (ref 70–99)
Potassium: 4.9 mmol/L (ref 3.5–5.2)
Sodium: 140 mmol/L (ref 134–144)
eGFR: 93 mL/min/{1.73_m2} (ref >59)

## 2023-09-15 NOTE — Telephone Encounter (Signed)
 Cardiac Catheterization scheduled at [facility] for: Monday September 19, 2023 9:30 AM Arrival time [facility] Main Entrance A at: 7:30 AM  Nothing to eat after midnight prior to procedure, clear liquids until 5 AM day of procedure.  Medication instructions: -Hold:  Lisinopril/HCT-AM of procedure -Other usual morning medications can be taken with sips of water including aspirin 81 mg.  Plan to go home the same day, you will only stay overnight if medically necessary.  You must have responsible adult to drive you home.  Someone must be with you the first 24 hours after you arrive home.   Left detailed message (DPR) with procedure instruction on voicemail, call if questions.

## 2023-09-19 ENCOUNTER — Other Ambulatory Visit: Payer: Self-pay

## 2023-09-19 ENCOUNTER — Inpatient Hospital Stay (HOSPITAL_COMMUNITY)
Admission: RE | Admit: 2023-09-19 | Discharge: 2023-09-25 | DRG: 216 | Disposition: A | Discharge status: Home Health | Attending: Thoracic Surgery (Cardiothoracic Vascular Surgery) | Admitting: Thoracic Surgery (Cardiothoracic Vascular Surgery)

## 2023-09-19 ENCOUNTER — Encounter: Payer: Self-pay | Admitting: Internal Medicine

## 2023-09-19 ENCOUNTER — Encounter (HOSPITAL_COMMUNITY): Payer: Self-pay | Admitting: Cardiology

## 2023-09-19 ENCOUNTER — Inpatient Hospital Stay (HOSPITAL_COMMUNITY)

## 2023-09-19 ENCOUNTER — Encounter (HOSPITAL_COMMUNITY)
Admission: RE | Disposition: A | Discharge status: Home Health | Payer: Self-pay | Source: Home / Self Care | Attending: Thoracic Surgery (Cardiothoracic Vascular Surgery)

## 2023-09-19 DIAGNOSIS — I10 Essential (primary) hypertension: Secondary | ICD-10-CM | POA: Diagnosis present

## 2023-09-19 DIAGNOSIS — Z833 Family history of diabetes mellitus: Secondary | ICD-10-CM | POA: Diagnosis not present

## 2023-09-19 DIAGNOSIS — Z87891 Personal history of nicotine dependence: Secondary | ICD-10-CM

## 2023-09-19 DIAGNOSIS — R7303 Prediabetes: Secondary | ICD-10-CM | POA: Diagnosis present

## 2023-09-19 DIAGNOSIS — I2510 Atherosclerotic heart disease of native coronary artery without angina pectoris: Secondary | ICD-10-CM

## 2023-09-19 DIAGNOSIS — Z79899 Other long term (current) drug therapy: Secondary | ICD-10-CM | POA: Diagnosis not present

## 2023-09-19 DIAGNOSIS — R739 Hyperglycemia, unspecified: Secondary | ICD-10-CM | POA: Diagnosis not present

## 2023-09-19 DIAGNOSIS — G8929 Other chronic pain: Secondary | ICD-10-CM | POA: Diagnosis present

## 2023-09-19 DIAGNOSIS — Z951 Presence of aortocoronary bypass graft: Secondary | ICD-10-CM | POA: Diagnosis not present

## 2023-09-19 DIAGNOSIS — I2584 Coronary atherosclerosis due to calcified coronary lesion: Secondary | ICD-10-CM | POA: Diagnosis present

## 2023-09-19 DIAGNOSIS — I471 Supraventricular tachycardia, unspecified: Secondary | ICD-10-CM | POA: Diagnosis not present

## 2023-09-19 DIAGNOSIS — M549 Dorsalgia, unspecified: Secondary | ICD-10-CM | POA: Diagnosis present

## 2023-09-19 DIAGNOSIS — Z7982 Long term (current) use of aspirin: Secondary | ICD-10-CM | POA: Diagnosis not present

## 2023-09-19 DIAGNOSIS — I25110 Atherosclerotic heart disease of native coronary artery with unstable angina pectoris: Principal | ICD-10-CM | POA: Diagnosis present

## 2023-09-19 DIAGNOSIS — I200 Unstable angina: Secondary | ICD-10-CM | POA: Diagnosis present

## 2023-09-19 DIAGNOSIS — Z8249 Family history of ischemic heart disease and other diseases of the circulatory system: Secondary | ICD-10-CM

## 2023-09-19 DIAGNOSIS — D65 Disseminated intravascular coagulation [defibrination syndrome]: Secondary | ICD-10-CM | POA: Diagnosis not present

## 2023-09-19 DIAGNOSIS — I350 Nonrheumatic aortic (valve) stenosis: Secondary | ICD-10-CM | POA: Diagnosis present

## 2023-09-19 DIAGNOSIS — Z01810 Encounter for preprocedural cardiovascular examination: Secondary | ICD-10-CM | POA: Diagnosis not present

## 2023-09-19 DIAGNOSIS — E7800 Pure hypercholesterolemia, unspecified: Secondary | ICD-10-CM | POA: Diagnosis present

## 2023-09-19 DIAGNOSIS — Z952 Presence of prosthetic heart valve: Secondary | ICD-10-CM

## 2023-09-19 HISTORY — DX: Pure hypercholesterolemia, unspecified: E78.00

## 2023-09-19 HISTORY — PX: LEFT HEART CATH AND CORONARY ANGIOGRAPHY: CATH118249

## 2023-09-19 LAB — CBC
HCT: 39.4 % (ref 39.0–52.0)
Hemoglobin: 13.3 g/dL (ref 13.0–17.0)
MCH: 28.7 pg (ref 26.0–34.0)
MCHC: 33.8 g/dL (ref 30.0–36.0)
MCV: 84.9 fL (ref 80.0–100.0)
Platelets: 304 10*3/uL (ref 150–400)
RBC: 4.64 MIL/uL (ref 4.22–5.81)
RDW: 13.4 % (ref 11.5–15.5)
WBC: 8.3 10*3/uL (ref 4.0–10.5)
nRBC: 0 % (ref 0.0–0.2)

## 2023-09-19 LAB — ECHOCARDIOGRAM COMPLETE
AR max vel: 1.06 cm2
AV Area VTI: 1.1 cm2
AV Area mean vel: 1.1 cm2
AV Mean grad: 16 mmHg
AV Peak grad: 29.8 mmHg
Ao pk vel: 2.73 m/s
Area-P 1/2: 2.75 cm2
Height: 72 in
S' Lateral: 2.1 cm
Weight: 3520 [oz_av]

## 2023-09-19 LAB — LIPID PANEL
Cholesterol: 191 mg/dL (ref 0–200)
HDL: 27 mg/dL — ABNORMAL LOW (ref >40)
LDL Cholesterol: 132 mg/dL — ABNORMAL HIGH (ref 0–99)
Total CHOL/HDL Ratio: 7.1 ratio
Triglycerides: 158 mg/dL — ABNORMAL HIGH (ref <150)
VLDL: 32 mg/dL (ref 0–40)

## 2023-09-19 LAB — CREATININE, SERUM
Creatinine, Ser: 0.97 mg/dL (ref 0.61–1.24)
GFR, Estimated: >60 mL/min (ref >60)

## 2023-09-19 MED ORDER — SODIUM CHLORIDE 0.9 % IV SOLN: 250.0000 mL | INTRAVENOUS | Status: AC | PRN

## 2023-09-19 MED ORDER — FENTANYL CITRATE (PF) 100 MCG/2ML IJ SOLN
INTRAMUSCULAR | Status: DC | PRN
  Administered 2023-09-19: 25 ug via INTRAVENOUS

## 2023-09-19 MED ORDER — ACETAMINOPHEN 325 MG PO TABS: 650.0000 mg | ORAL_TABLET | ORAL | Status: DC | PRN

## 2023-09-19 MED ORDER — ONDANSETRON HCL 4 MG/2ML IJ SOLN: 4.0000 mg | Freq: Four times a day (QID) | INTRAMUSCULAR | Status: DC | PRN

## 2023-09-19 MED ORDER — ASPIRIN 81 MG PO CHEW
81.0000 mg | CHEWABLE_TABLET | Freq: Every day | ORAL | Status: DC
  Administered 2023-09-19 – 2023-09-20 (×2): 81 mg via ORAL
  Filled 2023-09-19 (×2): qty 1

## 2023-09-19 MED ORDER — PERFLUTREN LIPID MICROSPHERE
1.0000 mL | INTRAVENOUS | Status: AC | PRN
  Administered 2023-09-19: 2 mL via INTRAVENOUS

## 2023-09-19 MED ORDER — ASPIRIN 81 MG PO CHEW: 81.0000 mg | CHEWABLE_TABLET | ORAL | Status: DC

## 2023-09-19 MED ORDER — AMLODIPINE BESYLATE 5 MG PO TABS
10.0000 mg | ORAL_TABLET | Freq: Every day | ORAL | Status: DC
  Administered 2023-09-19 – 2023-09-20 (×2): 10 mg via ORAL
  Filled 2023-09-19 (×2): qty 2

## 2023-09-19 MED ORDER — HYDROCODONE-ACETAMINOPHEN 10-325 MG PO TABS
1.0000 | ORAL_TABLET | Freq: Four times a day (QID) | ORAL | Status: DC | PRN
  Administered 2023-09-19 – 2023-09-20 (×2): 1 via ORAL
  Filled 2023-09-19 (×2): qty 1

## 2023-09-19 MED ORDER — LIDOCAINE HCL (PF) 1 % IJ SOLN
INTRAMUSCULAR | Status: DC | PRN
  Administered 2023-09-19: 2 mL

## 2023-09-19 MED ORDER — SODIUM CHLORIDE 0.9 % WEIGHT BASED INFUSION: 1.0000 mL/kg/h | INTRAVENOUS | Status: DC

## 2023-09-19 MED ORDER — TIZANIDINE HCL 4 MG PO TABS
4.0000 mg | ORAL_TABLET | Freq: Four times a day (QID) | ORAL | Status: DC | PRN
Start: 2023-09-19 — End: 2023-09-21

## 2023-09-19 MED ORDER — HEPARIN SODIUM (PORCINE) 1000 UNIT/ML IJ SOLN
INTRAMUSCULAR | Status: AC
  Filled 2023-09-19: qty 10

## 2023-09-19 MED ORDER — MIDAZOLAM HCL 2 MG/2ML IJ SOLN
INTRAMUSCULAR | Status: DC | PRN
  Administered 2023-09-19: 2 mg via INTRAVENOUS

## 2023-09-19 MED ORDER — MORPHINE SULFATE ER 15 MG PO TBCR
60.0000 mg | EXTENDED_RELEASE_TABLET | Freq: Two times a day (BID) | ORAL | Status: DC
  Administered 2023-09-19 – 2023-09-25 (×11): 60 mg via ORAL
  Filled 2023-09-19 (×8): qty 2
  Filled 2023-09-19: qty 4
  Filled 2023-09-19: qty 2
  Filled 2023-09-19: qty 4

## 2023-09-19 MED ORDER — ATORVASTATIN CALCIUM 40 MG PO TABS
40.0000 mg | ORAL_TABLET | Freq: Every day | ORAL | Status: DC
  Administered 2023-09-19: 40 mg via ORAL
  Filled 2023-09-19: qty 1

## 2023-09-19 MED ORDER — IOHEXOL 350 MG/ML SOLN
INTRAVENOUS | Status: DC | PRN
  Administered 2023-09-19: 50 mL

## 2023-09-19 MED ORDER — HEPARIN SODIUM (PORCINE) 5000 UNIT/ML IJ SOLN
5000.0000 [IU] | Freq: Three times a day (TID) | INTRAMUSCULAR | Status: DC
  Administered 2023-09-19 – 2023-09-20 (×4): 5000 [IU] via SUBCUTANEOUS
  Filled 2023-09-19 (×4): qty 1

## 2023-09-19 MED ORDER — NITROGLYCERIN 0.4 MG SL SUBL: 0.4000 mg | SUBLINGUAL_TABLET | SUBLINGUAL | Status: DC | PRN

## 2023-09-19 MED ORDER — VERAPAMIL HCL 2.5 MG/ML IV SOLN
INTRAVENOUS | Status: DC | PRN
  Administered 2023-09-19: 10 mL via INTRA_ARTERIAL

## 2023-09-19 MED ORDER — HEPARIN (PORCINE) IN NACL 2000-0.9 UNIT/L-% IV SOLN
INTRAVENOUS | Status: DC | PRN
  Administered 2023-09-19: 1000 mL

## 2023-09-19 MED ORDER — PANTOPRAZOLE SODIUM 40 MG PO TBEC
40.0000 mg | DELAYED_RELEASE_TABLET | Freq: Every day | ORAL | Status: DC
  Administered 2023-09-19 – 2023-09-20 (×2): 40 mg via ORAL
  Filled 2023-09-19 (×2): qty 1

## 2023-09-19 MED ORDER — MIDAZOLAM HCL 2 MG/2ML IJ SOLN
INTRAMUSCULAR | Status: AC
  Filled 2023-09-19: qty 2

## 2023-09-19 MED ORDER — SODIUM CHLORIDE 0.9% FLUSH: 3.0000 mL | INTRAVENOUS | Status: DC | PRN

## 2023-09-19 MED ORDER — VERAPAMIL HCL 2.5 MG/ML IV SOLN
INTRAVENOUS | Status: AC
  Filled 2023-09-19: qty 2

## 2023-09-19 MED ORDER — FENTANYL CITRATE (PF) 100 MCG/2ML IJ SOLN
INTRAMUSCULAR | Status: AC
  Filled 2023-09-19: qty 2

## 2023-09-19 MED ORDER — LIDOCAINE HCL (PF) 1 % IJ SOLN
INTRAMUSCULAR | Status: AC
  Filled 2023-09-19: qty 30

## 2023-09-19 MED ORDER — LISINOPRIL-HYDROCHLOROTHIAZIDE 10-12.5 MG PO TABS: 1.0000 | ORAL_TABLET | Freq: Every day | ORAL | Status: DC

## 2023-09-19 MED ORDER — SODIUM CHLORIDE 0.9 % WEIGHT BASED INFUSION: 3.0000 mL/kg/h | INTRAVENOUS | Status: DC

## 2023-09-19 MED ORDER — HYDROCHLOROTHIAZIDE 12.5 MG PO TABS
12.5000 mg | ORAL_TABLET | Freq: Every day | ORAL | Status: DC
Start: 2023-09-19 — End: 2023-09-20
  Administered 2023-09-19: 12.5 mg via ORAL
  Filled 2023-09-19: qty 1

## 2023-09-19 MED ORDER — LISINOPRIL 10 MG PO TABS
10.0000 mg | ORAL_TABLET | Freq: Every day | ORAL | Status: DC
  Administered 2023-09-19: 10 mg via ORAL
  Filled 2023-09-19: qty 1

## 2023-09-19 MED ORDER — HEPARIN SODIUM (PORCINE) 1000 UNIT/ML IJ SOLN
INTRAMUSCULAR | Status: DC | PRN
  Administered 2023-09-19: 5000 [IU] via INTRAVENOUS

## 2023-09-19 MED ORDER — GABAPENTIN 100 MG PO CAPS
100.0000 mg | ORAL_CAPSULE | Freq: Three times a day (TID) | ORAL | Status: DC
  Administered 2023-09-19 – 2023-09-25 (×16): 100 mg via ORAL
  Filled 2023-09-19 (×16): qty 1

## 2023-09-19 NOTE — Consult Note (Cosign Needed)
 [address]       [place] [address]             [phone]        THURLOW GALLAGA [facility] Medical Record #[id number] Date of Birth: 03/21/1956  Referring: Jacinto Halim Primary Care: Elfredia Nevins, MD Primary Cardiologist:Vishnu Norton Pastel, MD  Chief Complaint:   Outpatient Catheterization Procedure.. Revealed LM disease   History of Present Illness:      Alan Grant is a 68 yo male with known history of HTN, chronic pain, and Nicotine abuse.  He was referred for Cardiology evaluation in Feb 2024 due to complaints of exertional angina with radiation to his left arm.  At that time the patient was exertional but would resolve with rest. 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  He was started on Lopressor to help with chest pain relief, but overall this was discontinued by his PCP who changed his medications.  He presented for 1 year follow up on 09/13/23 at which time he continued to have angina which had now been occurring for a year.  It was initially infrequent, but as of late occurring multiple times per week.  It is now occurring with any exertional activities, making the patient avoid activities as he wanted to limit his chest pain.  He denied any other symptoms of shortness of breath, palpitations, N/V, dizziness or near syncope.  Due to this he felt patient should undergo cardiac catheterization and Echocardiogram.  Patient was agreeable to proceed.  He presented to [facility] today 09/19/2023 and underwent catheterization which revealed Left main disease.  It was felt that patient would best be treated with coronary bypass grafting and Cardiothoracic consultation was requested.  Currently the patient is chest pain free.  He denies associated symptoms when he experienced episodes of chest pain prior to admission.  He has long standing back problems for which he is prescribed  narcotics.  He is no longer smoking having quit in December, I commended patient on quitting.  He has positive family history of CAD.  He is willing to proceed with surgery, even though this was unexpected.  Current Activity/ Functional Status: Patient is independent with mobility/ambulation, transfers, ADL's, IADL's.    Past Medical History:  Diagnosis Date   Hypertension     Past Surgical History:  Procedure Laterality Date   cardiac catherization     COLONOSCOPY WITH PROPOFOL N/A 08/17/2018   Procedure: COLONOSCOPY WITH PROPOFOL;  Surgeon: Corbin Ade, MD;  [facility];  Service: Endoscopy;  Laterality: N/A;  10:45am   POLYPECTOMY  08/17/2018   Procedure: POLYPECTOMY;  Surgeon: Corbin Ade, MD;  [facility];  Service: Endoscopy;;  ileo-cecal valve (CSX2)/descending colon(CSx1)    Social History   Tobacco Use  Smoking Status Former   Current packs/day: 0.00   Average packs/day: 1 pack/day for 40.0 years (40.0 ttl pk-yrs)   Types: Cigarettes   Quit date: 06/27/2023   Years since quitting: 0.2  Smokeless Tobacco Current  Tobacco Comments   Nicotine pouches     Social History   Substance and Sexual Activity  Alcohol Use No     No Known Allergies  Current Facility-Administered Medications  Medication Dose Route Frequency Provider Last Rate Last Admin   0.9% sodium chloride infusion  1 mL/kg/hr Intravenous Continuous Mallipeddi, Orion Modest, MD       [  START ON 09/20/2023] aspirin chewable tablet 81 mg  81 mg Oral Pre-Cath Mallipeddi, Vishnu P, MD       fentaNYL (SUBLIMAZE) injection    PRN Yates Decamp, MD   25 mcg at 09/19/23 0926   Heparin (Porcine) in NaCl 2000-0.9 UNIT/L-% SOLN    PRN Yates Decamp, MD   1,000 mL at 09/19/23 0936   heparin sodium (porcine) injection    PRN Yates Decamp, MD   5,000 Units at 09/19/23 0937   iohexol (OMNIPAQUE) 350 MG/ML injection    PRN Yates Decamp, MD   50 mL at 09/19/23 0953   lidocaine (PF) (XYLOCAINE) 1 %  injection    PRN Yates Decamp, MD   2 mL at 09/19/23 0931   midazolam (VERSED) injection    PRN Yates Decamp, MD   2 mg at 09/19/23 1610   Radial Cocktail/Verapamil only    PRN Yates Decamp, MD   10 mL at 09/19/23 0935    Medications Prior to Admission  Medication Sig Dispense Refill Last Dose/Taking   amLODipine (NORVASC) 10 MG tablet Take 10 mg by mouth daily.   09/18/2023   aspirin EC 81 MG tablet Take 81 mg by mouth daily. Swallow whole.   09/19/2023 at  4:30 AM   Chlorphen-Phenyleph-ASA (ALKA-SELTZER SEVERE COLD PO) Take 1 Dose by mouth daily as needed (cold symptoms).   Taking As Needed   DM-Phenylephrine-Acetaminophen (VICKS DAYQUIL MULTI-SYMPTOM PO) Take 2 capsules by mouth every 4 (four) hours as needed (cold symptoms).   Taking As Needed   gabapentin (NEURONTIN) 100 MG capsule Take 100 mg by mouth 3 (three) times daily.   Past Week   HYDROcodone-acetaminophen (NORCO) 10-325 MG tablet Take 1 tablet by mouth every 6 (six) hours as needed for moderate pain.   09/18/2023   lisinopril-hydrochlorothiazide (ZESTORETIC) 10-12.5 MG tablet Take 1 tablet by mouth daily.   09/18/2023   morphine (MS CONTIN) 60 MG 12 hr tablet Take 60 mg by mouth every 12 (twelve) hours.   09/19/2023 Morning   nitroGLYCERIN (NITROSTAT) 0.4 MG SL tablet Place 1 tablet (0.4 mg total) under the tongue every 5 (five) minutes as needed for chest pain. If a single episode of chest pain is not relieved by one tablet, the patient will try another within 5 minutes; and if this doesn't relieve the pain, the patient is instructed to call 911 for transportation to an emergency department. 25 tablet 3 Taking As Needed   oxycodone (ROXICODONE) 30 MG immediate release tablet Take 30 mg by mouth every 4 (four) hours as needed for pain.   09/18/2023   pantoprazole (PROTONIX) 40 MG tablet Take 40 mg by mouth daily.   09/18/2023   phenol (CHLORASEPTIC) 1.4 % LIQD Use as directed 3 sprays in the mouth or throat as needed for throat irritation / pain.    Past Month   tiZANidine (ZANAFLEX) 4 MG tablet Take 4 mg by mouth every 6 (six) hours as needed for muscle spasms.   Past Month   Vitamin D, Ergocalciferol, (DRISDOL) 1.25 MG (50000 UNIT) CAPS capsule Take 50,000 Units by mouth every Tuesday.   Taking   ranolazine (RANEXA) 500 MG 12 hr tablet Take 1 tablet (500 mg total) by mouth 2 (two) times daily. 60 tablet 5     Family History  Problem Relation Age of Onset   Hypertension Father    Heart failure Father    Diabetes Father    Colon cancer Neg Hx  Review of Systems:       Cardiac Review of Systems: Y or  [    ]= no  Chest Pain [  Y  ]  Resting SOB [  N ] Exertional SOB  [ N ]  Orthopnea [  ]   Pedal Edema [   ]    Palpitations [  ] Syncope  [  ]   Presyncope [   ]  General Review of Systems: [Y] = yes [  ]=no Constitional: recent weight change Klaus.Mock  ]; anorexia [  ]; fatigue [ N ]; nausea Klaus.Mock  ]; night sweats [  ]; fever [  ]; or chills [  ]                                                               Dental: Last Dentist visit:  patient has full dentures  Eye : blurred vision [  ]; diplopia [   ]; vision changes [  ];  Amaurosis fugax[  ]; Resp: cough [ N ];  wheezing[  ];  hemoptysis[  ]; shortness of breath[  ]; paroxysmal nocturnal dyspnea[  ]; dyspnea on exertion[  ]; or orthopnea[  ];  GI:  gallstones[  ], vomiting[N  ];  dysphagia[  ]; melena[  ];  hematochezia [  ]; heartburn[  ];   Hx of  Colonoscopy[  ]; GU: kidney stones [  ]; hematuria[  ];   dysuria [  ];  nocturia[  ];  history of     obstruction [  ]; urinary frequency [  ]             Skin: rash, swelling[  ];, hair loss[  ];  peripheral edema[N  ];  or itching[  ]; + spider veins Musculosketetal: myalgias[  ];  joint swelling[  ];  joint erythema[  ];  joint pain[  ];  back pain[ Y, chronic ];  Heme/Lymph: bruising[  ];  bleeding[  ];  anemia[  ];  Neuro: TIA[  ];  headaches[  ];  stroke[N  ];  vertigo[  ];  seizures[  ];   paresthesias[  ];  difficulty walking[  N ];  Psych:depression[  ]; anxiety[  ];  Endocrine: diabetes[ N ];  thyroid dysfunction[ N ];  Physical Exam: BP 135/76   Pulse 65   Temp 98.2 F (36.8 C) (Oral)   Resp 16   Ht 6' (1.829 m)   Wt 99.8 kg   SpO2 93%   BMI 29.84 kg/m   General appearance: alert, cooperative, and no distress Head: Normocephalic, without obvious abnormality, atraumatic Neck: no adenopathy, no carotid bruit, no JVD, supple, symmetrical, trachea midline, and thyroid not enlarged, symmetric, no tenderness/mass/nodules Resp: clear to auscultation bilaterally Cardio: regular rate and rhythm, bradycardia, + blowing murmur GI: soft, non-tender; bowel sounds normal; no masses,  no organomegaly Extremities: extremities normal, atraumatic, no cyanosis or edema and spider veins present Neurologic: Grossly normal  Diagnostic Studies & Laboratory data:     Recent Radiology Findings:   No results found.   I have independently reviewed the above radiologic studies and discussed with the patient   Recent Lab Findings: Lab Results  Component Value Date   WBC 8.0 09/14/2023   HGB  14.3 09/14/2023   HCT 43.7 09/14/2023   PLT 243 09/14/2023   GLUCOSE 112 (H) 09/14/2023   NA 140 09/14/2023   K 4.9 09/14/2023   CL 100 09/14/2023   CREATININE 0.91 09/14/2023   BUN 11 09/14/2023   CO2 25 09/14/2023      Assessment / Plan:      CAD- outpatient cath revealed LM disease- patient admitted for coronary bypass grafting consultation.. patient is currently chest pain free Murmur- loud blowing on exam, would benefit from preop TTE to ensure no valvular abnormalities HTN- compliant with all medications Chronic Back pain- patient using MS Contin, Hydrocodone, Neurontin, Zanaflex on outpatient basis Nicotine Abuse- patient successfully quit in December  Patient admitted with LM disease. Currently patient is chest pain free.  He successfully quit smoking in December.  HTN on multiple agents preoperatively, well  controlled.  He appears to be a reasonable surgical candidate.  He will benefit from TTE to ensure no valvular abnormalities.  The risks and benefits of the procedure were explained to the patient and he was agreeable to proceed.  Dr.  Cliffton Asters will evaluate patient.  Surgery will be scheduled for Wednesday as this is the first OR availability.  I  spent 40 minutes counseling the patient face to face.  Dellie Piasecki, PA-C 09/19/2023 10:05 AM   Agree with above Symptomatic LM disease with moderate AS.  Edentulous.  Will plan for bAVR/CABG 3/26  Harrell O Lightfoot

## 2023-09-19 NOTE — Progress Notes (Signed)
 Echocardiogram 2D Echocardiogram has been performed.  Warren Lacy Osiel Stick RDCS 09/19/2023, 3:57 PM

## 2023-09-19 NOTE — Interval H&P Note (Signed)
 History and Physical Interval Note:  09/19/2023 9:23 AM  Alan Grant  has presented today for surgery, with the diagnosis of angina.  The various methods of treatment have been discussed with the patient and family. After consideration of risks, benefits and other options for treatment, the patient has consented to  Procedure(s): LEFT HEART CATH AND CORONARY ANGIOGRAPHY (N/A) and possible coronary angioplasty as a surgical intervention.  The patient's history has been reviewed, patient examined, no change in status, stable for surgery.  I have reviewed the patient's chart and labs.  Questions were answered to the patient's satisfaction.   Cath Lab Visit (complete for each Cath Lab visit)  Clinical Evaluation Leading to the Procedure:   ACS: Yes.    Non-ACS:    Anginal Classification: CCS IV  Anti-ischemic medical therapy: Maximal Therapy (2 or more classes of medications)  Non-Invasive Test Results: No non-invasive testing performed  Prior CABG: No previous CABG    Yates Decamp

## 2023-09-20 ENCOUNTER — Inpatient Hospital Stay (HOSPITAL_COMMUNITY)

## 2023-09-20 DIAGNOSIS — I200 Unstable angina: Secondary | ICD-10-CM | POA: Diagnosis not present

## 2023-09-20 DIAGNOSIS — Z01810 Encounter for preprocedural cardiovascular examination: Secondary | ICD-10-CM

## 2023-09-20 LAB — APTT: aPTT: 29 s (ref 24–36)

## 2023-09-20 LAB — URINALYSIS, ROUTINE W REFLEX MICROSCOPIC
Bilirubin Urine: NEGATIVE
Glucose, UA: NEGATIVE mg/dL
Hgb urine dipstick: NEGATIVE
Ketones, ur: NEGATIVE mg/dL
Leukocytes,Ua: NEGATIVE
Nitrite: NEGATIVE
Protein, ur: NEGATIVE mg/dL
Specific Gravity, Urine: 1.012 (ref 1.005–1.030)
pH: 6 (ref 5.0–8.0)

## 2023-09-20 LAB — ABO/RH: ABO/RH(D): O POS

## 2023-09-20 LAB — PROTIME-INR
INR: 1 (ref 0.8–1.2)
Prothrombin Time: 12.9 s (ref 11.4–15.2)

## 2023-09-20 LAB — SURGICAL PCR SCREEN
MRSA, PCR: NEGATIVE
Staphylococcus aureus: NEGATIVE

## 2023-09-20 LAB — PREPARE RBC (CROSSMATCH)

## 2023-09-20 LAB — SARS CORONAVIRUS 2 BY RT PCR: SARS Coronavirus 2 by RT PCR: NEGATIVE

## 2023-09-20 LAB — HEMOGLOBIN A1C
Hgb A1c MFr Bld: 6 % — ABNORMAL HIGH (ref 4.8–5.6)
Mean Plasma Glucose: 125.5 mg/dL

## 2023-09-20 MED ORDER — VANCOMYCIN HCL 1.5 G IV SOLR
1500.0000 mg | INTRAVENOUS | Status: AC
Start: 2023-09-21 — End: 2023-09-22
  Administered 2023-09-21: 1500 mg via INTRAVENOUS
  Filled 2023-09-20: qty 30

## 2023-09-20 MED ORDER — INSULIN REGULAR(HUMAN) IN NACL 100-0.9 UT/100ML-% IV SOLN
INTRAVENOUS | Status: AC
Start: 2023-09-21 — End: 2023-09-22
  Administered 2023-09-21: 1.4 [IU]/h via INTRAVENOUS
  Filled 2023-09-20: qty 100

## 2023-09-20 MED ORDER — TRANEXAMIC ACID (OHS) PUMP PRIME SOLUTION
2.0000 mg/kg | INTRAVENOUS | Status: DC
  Filled 2023-09-20: qty 1.96

## 2023-09-20 MED ORDER — HEPARIN 30,000 UNITS/1000 ML (OHS) CELLSAVER SOLUTION
Status: DC
Start: 2023-09-21 — End: 2023-09-22
  Filled 2023-09-20: qty 1000

## 2023-09-20 MED ORDER — CEFAZOLIN SODIUM-DEXTROSE 2-4 GM/100ML-% IV SOLN
2.0000 g | INTRAVENOUS | Status: DC
Start: 2023-09-21 — End: 2023-09-22
  Filled 2023-09-20: qty 100

## 2023-09-20 MED ORDER — EPINEPHRINE HCL 5 MG/250ML IV SOLN IN NS
0.0000 ug/min | INTRAVENOUS | Status: DC
  Filled 2023-09-20: qty 250

## 2023-09-20 MED ORDER — PANTOPRAZOLE SODIUM 40 MG PO TBEC
40.0000 mg | DELAYED_RELEASE_TABLET | Freq: Every day | ORAL | Status: DC
  Administered 2023-09-20: 40 mg via ORAL
  Filled 2023-09-20: qty 1

## 2023-09-20 MED ORDER — CHLORHEXIDINE GLUCONATE CLOTH 2 % EX PADS
6.0000 | MEDICATED_PAD | Freq: Once | CUTANEOUS | Status: AC
  Administered 2023-09-20: 6 via TOPICAL

## 2023-09-20 MED ORDER — CEFAZOLIN SODIUM-DEXTROSE 2-4 GM/100ML-% IV SOLN
2.0000 g | INTRAVENOUS | Status: AC
  Administered 2023-09-21 (×2): 2 g via INTRAVENOUS
  Filled 2023-09-20: qty 100

## 2023-09-20 MED ORDER — TEMAZEPAM 15 MG PO CAPS
15.0000 mg | ORAL_CAPSULE | Freq: Once | ORAL | Status: AC | PRN
  Administered 2023-09-20: 15 mg via ORAL
  Filled 2023-09-20: qty 1

## 2023-09-20 MED ORDER — MANNITOL 20 % IV SOLN
INTRAVENOUS | Status: DC
  Filled 2023-09-20: qty 13

## 2023-09-20 MED ORDER — PLASMA-LYTE A IV SOLN
INTRAVENOUS | Status: DC
  Filled 2023-09-20: qty 2.5

## 2023-09-20 MED ORDER — NOREPINEPHRINE 4 MG/250ML-% IV SOLN
0.0000 ug/min | INTRAVENOUS | Status: DC
  Filled 2023-09-20: qty 250

## 2023-09-20 MED ORDER — ~~LOC~~ CARDIAC SURGERY, PATIENT & FAMILY EDUCATION
Freq: Once | Status: AC
  Filled 2023-09-20: qty 1

## 2023-09-20 MED ORDER — DEXMEDETOMIDINE HCL IN NACL 400 MCG/100ML IV SOLN
0.1000 ug/kg/h | INTRAVENOUS | Status: AC
  Administered 2023-09-21: .4 ug/kg/h via INTRAVENOUS
  Filled 2023-09-20: qty 100

## 2023-09-20 MED ORDER — POTASSIUM CHLORIDE 2 MEQ/ML IV SOLN
80.0000 meq | INTRAVENOUS | Status: DC
  Filled 2023-09-20: qty 40

## 2023-09-20 MED ORDER — NITROGLYCERIN IN D5W 200-5 MCG/ML-% IV SOLN
2.0000 ug/min | INTRAVENOUS | Status: AC
  Administered 2023-09-21: 50 ug/min via INTRAVENOUS
  Filled 2023-09-20: qty 250

## 2023-09-20 MED ORDER — BISACODYL 5 MG PO TBEC: 5.0000 mg | DELAYED_RELEASE_TABLET | Freq: Once | ORAL | Status: DC

## 2023-09-20 MED ORDER — PHENYLEPHRINE HCL-NACL 20-0.9 MG/250ML-% IV SOLN
30.0000 ug/min | INTRAVENOUS | Status: AC
  Administered 2023-09-21: 10 ug/min via INTRAVENOUS
  Filled 2023-09-20: qty 250

## 2023-09-20 MED ORDER — TRANEXAMIC ACID (OHS) BOLUS VIA INFUSION
15.0000 mg/kg | INTRAVENOUS | Status: AC
  Administered 2023-09-21: 1473 mg via INTRAVENOUS
  Filled 2023-09-20: qty 1473

## 2023-09-20 MED ORDER — TRANEXAMIC ACID 1000 MG/10ML IV SOLN
1.5000 mg/kg/h | INTRAVENOUS | Status: AC
Start: 2023-09-21 — End: 2023-09-22
  Administered 2023-09-21: 1.5 mg/kg/h via INTRAVENOUS
  Filled 2023-09-20: qty 25

## 2023-09-20 MED ORDER — METOPROLOL TARTRATE 25 MG PO TABS
25.0000 mg | ORAL_TABLET | Freq: Two times a day (BID) | ORAL | Status: DC
  Administered 2023-09-20 (×2): 25 mg via ORAL
  Filled 2023-09-20 (×2): qty 1

## 2023-09-20 MED ORDER — MILRINONE LACTATE IN DEXTROSE 20-5 MG/100ML-% IV SOLN
0.3000 ug/kg/min | INTRAVENOUS | Status: DC
  Filled 2023-09-20: qty 100

## 2023-09-20 MED ORDER — CHLORHEXIDINE GLUCONATE 0.12 % MT SOLN
15.0000 mL | Freq: Once | OROMUCOSAL | Status: AC
  Administered 2023-09-21: 15 mL via OROMUCOSAL

## 2023-09-20 MED ORDER — ATORVASTATIN CALCIUM 80 MG PO TABS
80.0000 mg | ORAL_TABLET | Freq: Every day | ORAL | Status: DC
  Administered 2023-09-20 – 2023-09-25 (×5): 80 mg via ORAL
  Filled 2023-09-20 (×5): qty 1

## 2023-09-20 MED ORDER — METOPROLOL TARTRATE 12.5 MG HALF TABLET
12.5000 mg | ORAL_TABLET | Freq: Once | ORAL | Status: AC
  Administered 2023-09-21: 12.5 mg via ORAL
  Filled 2023-09-20: qty 1

## 2023-09-20 MED ORDER — PANTOPRAZOLE SODIUM 40 MG PO TBEC: 40.0000 mg | DELAYED_RELEASE_TABLET | Freq: Every day | ORAL | Status: DC

## 2023-09-20 NOTE — Progress Notes (Signed)
 CARDIAC REHAB PHASE I      Pre-op OHS education including OHS booklet, OHS handout, IS use, mobility importance, home needs at discharge and sternal precautions/move in the tube reviewed. All questions and concerns addressed. Will continue to follow.  [id number] Woodroe Chen, RN BSN 09/20/2023 10:47 AM

## 2023-09-20 NOTE — Anesthesia Preprocedure Evaluation (Signed)
 Anesthesia Evaluation  Patient identified by MRN, date of birth, ID band Patient awake    Reviewed: Allergy & Precautions, NPO status , Patient's Chart, lab work & pertinent test results  History of Anesthesia Complications Negative for: history of anesthetic complications  Airway Mallampati: III  TM Distance: >3 FB Neck ROM: Full    Dental  (+) Edentulous Upper, Edentulous Lower   Pulmonary former smoker   Pulmonary exam normal        Cardiovascular hypertension, Pt. on medications + angina  + CAD  + Valvular Problems/Murmurs  Rhythm:Regular Rate:Normal + Systolic murmurs  '25 TTE - EF 60 to 65%. There is mild concentric left ventricular hypertrophy. Grade I diastolic dysfunction (impaired relaxation). RV mildly enlarged. Trivial MR. Aortic stenosis is mild by velocity, moderate by valve area of 1.1cm^2 and dimensionless index of 0.37. There is severe calcifcation of the aortic valve. Moderate aortic valve stenosis. Aortic valve area, by VTI measures 1.10 cm. Aortic valve mean gradient measures 16.0 mmHg. Aortic valve Vmax measures 2.73 m/s.   '25 Cath - LM: Calcified, ostial LM has a 90 to 95% stenosis. LAD: Gives origin to a large D1 proximal which is calcified ostial 90% stenosis. LAD after the origin of D1 in the mid segment has a 90% calcific complex stenosis.  Mid to distal LAD is got mild disease. LCx: Dominant vessel.  Again proximal calcification is evident.  Proximal CX has a 90 to 95% calcific stenosis.  Large OM1, moderate-sized OM 3 and a large OM 4 and PDA branches distally.  Distal CX has 95% stenosis. RCA: Nondominant and small, mild disease. LIMA and RIMA are widely patent.     Neuro/Psych negative neurological ROS  negative psych ROS   GI/Hepatic negative GI ROS, Neg liver ROS,,,  Endo/Other  negative endocrine ROS    Renal/GU negative Renal ROS     Musculoskeletal  (+)  narcotic dependent   Abdominal   Peds  Hematology negative hematology ROS (+)   Anesthesia Other Findings   Reproductive/Obstetrics                             Anesthesia Physical Anesthesia Plan  ASA: 4  Anesthesia Plan: General   Post-op Pain Management:    Induction: Intravenous  PONV Risk Score and Plan: 2 and Treatment may vary due to age or medical condition, Ondansetron and Dexamethasone  Airway Management Planned: Oral ETT  Additional Equipment: Arterial line, CVP, TEE and Ultrasound Guidance Line Placement  Intra-op Plan:   Post-operative Plan: Post-operative intubation/ventilation  Informed Consent: I have reviewed the patients History and Physical, chart, labs and discussed the procedure including the risks, benefits and alternatives for the proposed anesthesia with the patient or authorized representative who has indicated his/her understanding and acceptance.     Dental advisory given  Plan Discussed with: CRNA and Anesthesiologist  Anesthesia Plan Comments:        Anesthesia Quick Evaluation

## 2023-09-20 NOTE — Plan of Care (Signed)
  Problem: Education: Goal: Understanding of CV disease, CV risk reduction, and recovery process will improve Outcome: Progressing   Problem: Cardiovascular: Goal: Vascular access site(s) Level 0-1 will be maintained Outcome: Progressing   Problem: Health Behavior/Discharge Planning: Goal: Ability to safely manage health-related needs after discharge will improve Outcome: Progressing   Problem: Clinical Measurements: Goal: Ability to maintain clinical measurements within normal limits will improve Outcome: Progressing   Problem: Clinical Measurements: Goal: Cardiovascular complication will be avoided Outcome: Progressing   Problem: Activity: Goal: Risk for activity intolerance will decrease Outcome: Progressing   Problem: Nutrition: Goal: Adequate nutrition will be maintained Outcome: Progressing   Problem: Coping: Goal: Level of anxiety will decrease Outcome: Progressing   Problem: Elimination: Goal: Will not experience complications related to bowel motility Outcome: Progressing   Problem: Elimination: Goal: Will not experience complications related to urinary retention Outcome: Progressing   Problem: Pain Managment: Goal: General experience of comfort will improve and/or be controlled Outcome: Progressing   Problem: Safety: Goal: Ability to remain free from injury will improve Outcome: Progressing

## 2023-09-20 NOTE — Progress Notes (Addendum)
 Patient Name: Alan Grant Date of Encounter: 09/20/2023 [facility] Cardiologist: Marjo Bicker, MD   Interval Summary  .    No chest pain this morning. Planned for CABG tomorrow.   Vital Signs .    Vitals:   09/19/23 1552 09/19/23 1736 09/19/23 2015 09/20/23 0446  BP: 137/84 132/84 119/69 121/79  Pulse: 77 74 89 85  Resp: 19  18 18   Temp: 98.1 F (36.7 C)  97.9 F (36.6 C) 98 F (36.7 C)  TempSrc: Oral  Oral Oral  SpO2: 96% 93% 95% 96%  Weight: 98.2 kg     Height: 6' (1.829 m)      No intake or output data in the 24 hours ending 09/20/23 0822    09/19/2023    3:52 PM 09/19/2023    8:05 AM 09/13/2023    1:01 PM  Last 3 Weights  Weight (lbs) 216 lb 9.6 oz 220 lb 226 lb 3.2 oz  Weight (kg) 98.249 kg 99.791 kg 102.604 kg      Telemetry/ECG    Sinus Rhythm with few episodes of sinus tach - Personally Reviewed  Physical Exam .    GEN: No acute distress.   Neck: No JVD Cardiac: RRR, soft systolic murmur RUSB, no rubs, or gallops.  Respiratory: Clear to auscultation bilaterally. GI: Soft, nontender, non-distended  MS: No edema  Assessment & Plan .     68 yo male with PMH of HTN, prior tobacco use who presented for outpatient cardiac cath in the setting of worsening angina.   Unstable angina CAD -- Underwent cardiac catheterization 3/24 with 95% left main, 95% proximal circumflex, 90% proximal LAD.  Recommendations for evaluation by CT surgery, tentatively on the schedule for Wednesday.  Echocardiogram with LVEF of 60 to 65%, no regional wall motion abnormality, grade 1 diastolic dysfunction, normal RV function, moderate aortic stenosis, mean gradient 16 mmHg. -- Continue aspirin, statin, add metoprolol  Moderate aortic stenosis -- echo showed moderate aortic stenosis, mean gradient of , ASA 1.10cm  HTN -- stable -- will hold hydrochlorothiazide with plans for surgery, add metoprolol 25mg  BID  HLD -- LDL 132, HDL 27 -- increase  atorvastatin to 80mg  daily  PreDM -- Hgb A1c 6.0  For questions or updates, please contact [facility] Please consult [email or web address] for contact info under        Signed, Laverda Page, NP   Patient seen, examined. Available data reviewed. Agree with findings, assessment, and plan as outlined by Laverda Page, NP.  The patient is independently interviewed and examined.  He is well-appearing, sitting up on the side of the bed.  HEENT normal, JVP normal, lungs clear bilaterally, heart is regular rate and rhythm with a 2/6 systolic murmur at the right upper sternal border, abdomen soft nontender, right radial cath site is clear, lower extremities have no edema.  The patient is pending multivessel CABG tomorrow for treatment of severe 95% left main stenosis.  I personally reviewed his echo images which are notable for poor acoustic windows.  However, I am concerned that he has borderline mild to moderate aortic stenosis.  His exam is suggestive of moderate aortic stenosis.  He does have some degenerative changes of the aortic valve without severe calcification or restriction but his aortic valve area calculates to 1.1 cm.  Would consider bioprosthetic aortic valve replacement with his CABG.  Otherwise, plan as outlined above patient on appropriate cardiac medicines with aspirin, statin, and metoprolol.  Tonny Bollman, M.D.  09/20/2023 11:01 AM

## 2023-09-20 NOTE — TOC Initial Note (Signed)
 Transition of Care [facility]) - Initial/Assessment Note    Patient Details  Name: Alan Grant MRN: [id number] Date of Birth: 05-29-1956  Transition of Care [facility] CM/SW Contact:    Gala Lewandowsky, RN Phone Number: 09/20/2023, 4:22 PM  Clinical Narrative:  Patient presented for chest pain-plan for CABG 09-21-23. PTA patient was from home with spouse. Case Manager will continue to follow for transition of care needs as the patient progresses.                  Expected Discharge Plan: Home w Home Health Services Barriers to Discharge: Continued Medical Work up   Patient Goals and CMS Choice Patient states their goals for this hospitalization and ongoing recovery are:: plan to return home once stable.  Expected Discharge Plan and Services   Discharge Planning Services: CM Consult   Living arrangements for the past 2 months: Mobile Home   Prior Living Arrangements/Services Living arrangements for the past 2 months: Mobile Home Lives with:: Spouse Patient language and need for interpreter reviewed:: Yes Do you feel safe going back to the place where you live?: Yes            Criminal Activity/Legal Involvement Pertinent to Current Situation/Hospitalization: No - Comment as needed  Activities of Daily Living   ADL Screening (condition at time of admission) Independently performs ADLs?: Yes (appropriate for developmental age) Is the patient deaf or have difficulty hearing?: No Does the patient have difficulty seeing, even when wearing glasses/contacts?: No Does the patient have difficulty concentrating, remembering, or making decisions?: No  Permission Sought/Granted Permission sought to share information with : Family Supports, Case Manager    Emotional Assessment Appearance:: Appears stated age Attitude/Demeanor/Rapport: Engaged Affect (typically observed): Appropriate Orientation: : Oriented to Self, Oriented to Place, Oriented to  Time, Oriented to  Situation Alcohol / Substance Use: Not Applicable Psych Involvement: No (comment)  Admission diagnosis:  Unstable angina pectoris (HCC) [I20.0] Patient Active Problem List   Diagnosis Date Noted   Unstable angina pectoris (HCC) 09/19/2023   HTN, goal below 130/80 08/05/2022   Nicotine abuse 08/05/2022   Cardiac chest pain 08/05/2022   Bilateral leg pain 08/05/2022   Preventative health care 07/20/2018   PCP:  Elfredia Nevins, MD Pharmacy:   [id number] [place], [place] - [address] AT [facility] [address] [facility] [address] [place] [address] Phone: [phone] Fax: [phone]  Social Drivers of Health (SDOH) Social History: SDOH Screenings   Food Insecurity: No Food Insecurity (09/19/2023)  Housing: Low Risk  (09/19/2023)  Transportation Needs: No Transportation Needs (09/19/2023)  Utilities: Not At Risk (09/19/2023)  Social Connections: Socially Integrated (09/19/2023)  Tobacco Use: High Risk (09/19/2023)   Readmission Risk Interventions     No data to display

## 2023-09-20 NOTE — Progress Notes (Signed)
 Pre-CABG vascular exams have been completed.    Results can be found under chart review under CV PROC. 09/20/2023 12:59 PM Macarena Langseth RVT, RDMS

## 2023-09-21 ENCOUNTER — Inpatient Hospital Stay (HOSPITAL_COMMUNITY)
Admission: RE | Disposition: A | Discharge status: Home Health | Payer: Self-pay | Source: Home / Self Care | Attending: Thoracic Surgery (Cardiothoracic Vascular Surgery)

## 2023-09-21 ENCOUNTER — Encounter (HOSPITAL_COMMUNITY): Payer: Self-pay | Admitting: Cardiology

## 2023-09-21 ENCOUNTER — Inpatient Hospital Stay (HOSPITAL_COMMUNITY)

## 2023-09-21 ENCOUNTER — Inpatient Hospital Stay (HOSPITAL_COMMUNITY): Payer: Self-pay | Admitting: Certified Registered Nurse Anesthetist

## 2023-09-21 ENCOUNTER — Other Ambulatory Visit: Payer: Self-pay

## 2023-09-21 DIAGNOSIS — Z87891 Personal history of nicotine dependence: Secondary | ICD-10-CM

## 2023-09-21 DIAGNOSIS — I10 Essential (primary) hypertension: Secondary | ICD-10-CM

## 2023-09-21 DIAGNOSIS — I200 Unstable angina: Secondary | ICD-10-CM

## 2023-09-21 DIAGNOSIS — Z951 Presence of aortocoronary bypass graft: Secondary | ICD-10-CM

## 2023-09-21 DIAGNOSIS — I2510 Atherosclerotic heart disease of native coronary artery without angina pectoris: Secondary | ICD-10-CM | POA: Diagnosis not present

## 2023-09-21 DIAGNOSIS — I25110 Atherosclerotic heart disease of native coronary artery with unstable angina pectoris: Secondary | ICD-10-CM

## 2023-09-21 DIAGNOSIS — R739 Hyperglycemia, unspecified: Secondary | ICD-10-CM

## 2023-09-21 DIAGNOSIS — I350 Nonrheumatic aortic (valve) stenosis: Secondary | ICD-10-CM

## 2023-09-21 HISTORY — PX: INTRAOPERATIVE TRANSESOPHAGEAL ECHOCARDIOGRAM: SHX5062

## 2023-09-21 HISTORY — PX: CORONARY ARTERY BYPASS GRAFT: SHX141

## 2023-09-21 HISTORY — PX: AORTIC VALVE REPLACEMENT: SHX41

## 2023-09-21 LAB — CBC
HCT: 43.8 % (ref 39.0–52.0)
HCT: 30.2 % — ABNORMAL LOW (ref 39.0–52.0)
HCT: 30.4 % — ABNORMAL LOW (ref 39.0–52.0)
Hemoglobin: 14.5 g/dL (ref 13.0–17.0)
Hemoglobin: 10.1 g/dL — ABNORMAL LOW (ref 13.0–17.0)
Hemoglobin: 10.1 g/dL — ABNORMAL LOW (ref 13.0–17.0)
MCH: 28.5 pg (ref 26.0–34.0)
MCH: 29.4 pg (ref 26.0–34.0)
MCH: 28.8 pg (ref 26.0–34.0)
MCHC: 33.1 g/dL (ref 30.0–36.0)
MCHC: 33.4 g/dL (ref 30.0–36.0)
MCHC: 33.2 g/dL (ref 30.0–36.0)
MCV: 86.1 fL (ref 80.0–100.0)
MCV: 87.8 fL (ref 80.0–100.0)
MCV: 86.6 fL (ref 80.0–100.0)
Platelets: 339 10*3/uL (ref 150–400)
Platelets: 186 10*3/uL (ref 150–400)
Platelets: 210 10*3/uL (ref 150–400)
RBC: 5.09 MIL/uL (ref 4.22–5.81)
RBC: 3.44 MIL/uL — ABNORMAL LOW (ref 4.22–5.81)
RBC: 3.51 MIL/uL — ABNORMAL LOW (ref 4.22–5.81)
RDW: 13.2 % (ref 11.5–15.5)
RDW: 13.2 % (ref 11.5–15.5)
RDW: 13.2 % (ref 11.5–15.5)
WBC: 10.2 10*3/uL (ref 4.0–10.5)
WBC: 12.5 10*3/uL — ABNORMAL HIGH (ref 4.0–10.5)
WBC: 11.2 10*3/uL — ABNORMAL HIGH (ref 4.0–10.5)
nRBC: 0 % (ref 0.0–0.2)
nRBC: 0 % (ref 0.0–0.2)
nRBC: 0 % (ref 0.0–0.2)

## 2023-09-21 LAB — POCT I-STAT 7, (LYTES, BLD GAS, ICA,H+H)
Acid-Base Excess: 0 mmol/L (ref 0.0–2.0)
Acid-base deficit: 1 mmol/L (ref 0.0–2.0)
Acid-base deficit: 3 mmol/L — ABNORMAL HIGH (ref 0.0–2.0)
Acid-base deficit: 4 mmol/L — ABNORMAL HIGH (ref 0.0–2.0)
Acid-base deficit: 12 mmol/L — ABNORMAL HIGH (ref 0.0–2.0)
Acid-base deficit: 14 mmol/L — ABNORMAL HIGH (ref 0.0–2.0)
Acid-base deficit: 6 mmol/L — ABNORMAL HIGH (ref 0.0–2.0)
Bicarbonate: 25.1 mmol/L (ref 20.0–28.0)
Bicarbonate: 25.1 mmol/L (ref 20.0–28.0)
Bicarbonate: 22.8 mmol/L (ref 20.0–28.0)
Bicarbonate: 22 mmol/L (ref 20.0–28.0)
Bicarbonate: 11.3 mmol/L — ABNORMAL LOW (ref 20.0–28.0)
Bicarbonate: 13.5 mmol/L — ABNORMAL LOW (ref 20.0–28.0)
Bicarbonate: 18.4 mmol/L — ABNORMAL LOW (ref 20.0–28.0)
Calcium, Ion: 1.09 mmol/L — ABNORMAL LOW (ref 1.15–1.40)
Calcium, Ion: 1.16 mmol/L (ref 1.15–1.40)
Calcium, Ion: 1.27 mmol/L (ref 1.15–1.40)
Calcium, Ion: 1.27 mmol/L (ref 1.15–1.40)
Calcium, Ion: 0.86 mmol/L — CL (ref 1.15–1.40)
Calcium, Ion: 0.96 mmol/L — ABNORMAL LOW (ref 1.15–1.40)
Calcium, Ion: 1.12 mmol/L — ABNORMAL LOW (ref 1.15–1.40)
HCT: 28 % — ABNORMAL LOW (ref 39.0–52.0)
HCT: 29 % — ABNORMAL LOW (ref 39.0–52.0)
HCT: 26 % — ABNORMAL LOW (ref 39.0–52.0)
HCT: 29 % — ABNORMAL LOW (ref 39.0–52.0)
HCT: 20 % — ABNORMAL LOW (ref 39.0–52.0)
HCT: 23 % — ABNORMAL LOW (ref 39.0–52.0)
HCT: 27 % — ABNORMAL LOW (ref 39.0–52.0)
Hemoglobin: 9.5 g/dL — ABNORMAL LOW (ref 13.0–17.0)
Hemoglobin: 9.9 g/dL — ABNORMAL LOW (ref 13.0–17.0)
Hemoglobin: 8.8 g/dL — ABNORMAL LOW (ref 13.0–17.0)
Hemoglobin: 9.9 g/dL — ABNORMAL LOW (ref 13.0–17.0)
Hemoglobin: 6.8 g/dL — CL (ref 13.0–17.0)
Hemoglobin: 7.8 g/dL — ABNORMAL LOW (ref 13.0–17.0)
Hemoglobin: 9.2 g/dL — ABNORMAL LOW (ref 13.0–17.0)
O2 Saturation: 100 %
O2 Saturation: 100 %
O2 Saturation: 100 %
O2 Saturation: 91 %
O2 Saturation: 94 %
O2 Saturation: 95 %
O2 Saturation: 94 %
Patient temperature: 37
Patient temperature: 37
Patient temperature: 37.7
Potassium: 3.9 mmol/L (ref 3.5–5.1)
Potassium: 5.2 mmol/L — ABNORMAL HIGH (ref 3.5–5.1)
Potassium: 4.8 mmol/L (ref 3.5–5.1)
Potassium: 4.8 mmol/L (ref 3.5–5.1)
Potassium: 2.6 mmol/L — CL (ref 3.5–5.1)
Potassium: 3 mmol/L — ABNORMAL LOW (ref 3.5–5.1)
Potassium: 3.8 mmol/L (ref 3.5–5.1)
Sodium: 140 mmol/L (ref 135–145)
Sodium: 137 mmol/L (ref 135–145)
Sodium: 139 mmol/L (ref 135–145)
Sodium: 139 mmol/L (ref 135–145)
Sodium: 146 mmol/L — ABNORMAL HIGH (ref 135–145)
Sodium: 149 mmol/L — ABNORMAL HIGH (ref 135–145)
Sodium: 141 mmol/L (ref 135–145)
TCO2: 27 mmol/L (ref 22–32)
TCO2: 26 mmol/L (ref 22–32)
TCO2: 24 mmol/L (ref 22–32)
TCO2: 23 mmol/L (ref 22–32)
TCO2: 12 mmol/L — ABNORMAL LOW (ref 22–32)
TCO2: 14 mmol/L — ABNORMAL LOW (ref 22–32)
TCO2: 19 mmol/L — ABNORMAL LOW (ref 22–32)
pCO2 arterial: 50.1 mmHg — ABNORMAL HIGH (ref 32–48)
pCO2 arterial: 42.4 mmHg (ref 32–48)
pCO2 arterial: 46.1 mmHg (ref 32–48)
pCO2 arterial: 42.6 mmHg (ref 32–48)
pCO2 arterial: 23.5 mmHg — ABNORMAL LOW (ref 32–48)
pCO2 arterial: 26.8 mmHg — ABNORMAL LOW (ref 32–48)
pCO2 arterial: 33.1 mmHg (ref 32–48)
pH, Arterial: 7.308 — ABNORMAL LOW (ref 7.35–7.45)
pH, Arterial: 7.381 (ref 7.35–7.45)
pH, Arterial: 7.301 — ABNORMAL LOW (ref 7.35–7.45)
pH, Arterial: 7.322 — ABNORMAL LOW (ref 7.35–7.45)
pH, Arterial: 7.292 — ABNORMAL LOW (ref 7.35–7.45)
pH, Arterial: 7.308 — ABNORMAL LOW (ref 7.35–7.45)
pH, Arterial: 7.355 (ref 7.35–7.45)
pO2, Arterial: 223 mmHg — ABNORMAL HIGH (ref 83–108)
pO2, Arterial: 319 mmHg — ABNORMAL HIGH (ref 83–108)
pO2, Arterial: 187 mmHg — ABNORMAL HIGH (ref 83–108)
pO2, Arterial: 67 mmHg — ABNORMAL LOW (ref 83–108)
pO2, Arterial: 76 mmHg — ABNORMAL LOW (ref 83–108)
pO2, Arterial: 81 mmHg — ABNORMAL LOW (ref 83–108)
pO2, Arterial: 77 mmHg — ABNORMAL LOW (ref 83–108)

## 2023-09-21 LAB — BASIC METABOLIC PANEL
Anion gap: 9 (ref 5–15)
Anion gap: 8 (ref 5–15)
BUN: 15 mg/dL (ref 8–23)
BUN: 11 mg/dL (ref 8–23)
CO2: 26 mmol/L (ref 22–32)
CO2: 23 mmol/L (ref 22–32)
Calcium: 9.8 mg/dL (ref 8.9–10.3)
Calcium: 8 mg/dL — ABNORMAL LOW (ref 8.9–10.3)
Chloride: 101 mmol/L (ref 98–111)
Chloride: 107 mmol/L (ref 98–111)
Creatinine, Ser: 1.01 mg/dL (ref 0.61–1.24)
Creatinine, Ser: 0.73 mg/dL (ref 0.61–1.24)
GFR, Estimated: >60 mL/min (ref >60)
GFR, Estimated: >60 mL/min (ref >60)
Glucose, Bld: 93 mg/dL (ref 70–99)
Glucose, Bld: 127 mg/dL — ABNORMAL HIGH (ref 70–99)
Potassium: 4.5 mmol/L (ref 3.5–5.1)
Potassium: 4 mmol/L (ref 3.5–5.1)
Sodium: 136 mmol/L (ref 135–145)
Sodium: 138 mmol/L (ref 135–145)

## 2023-09-21 LAB — POCT I-STAT, CHEM 8
BUN: 16 mg/dL (ref 8–23)
BUN: 13 mg/dL (ref 8–23)
BUN: 14 mg/dL (ref 8–23)
BUN: 15 mg/dL (ref 8–23)
BUN: 15 mg/dL (ref 8–23)
BUN: 14 mg/dL (ref 8–23)
Calcium, Ion: 1.27 mmol/L (ref 1.15–1.40)
Calcium, Ion: 1.15 mmol/L (ref 1.15–1.40)
Calcium, Ion: 1.12 mmol/L — ABNORMAL LOW (ref 1.15–1.40)
Calcium, Ion: 1.14 mmol/L — ABNORMAL LOW (ref 1.15–1.40)
Calcium, Ion: 1.14 mmol/L — ABNORMAL LOW (ref 1.15–1.40)
Calcium, Ion: 1.26 mmol/L (ref 1.15–1.40)
Chloride: 102 mmol/L (ref 98–111)
Chloride: 108 mmol/L (ref 98–111)
Chloride: 106 mmol/L (ref 98–111)
Chloride: 105 mmol/L (ref 98–111)
Chloride: 106 mmol/L (ref 98–111)
Chloride: 108 mmol/L (ref 98–111)
Creatinine, Ser: 0.8 mg/dL (ref 0.61–1.24)
Creatinine, Ser: 0.7 mg/dL (ref 0.61–1.24)
Creatinine, Ser: 0.7 mg/dL (ref 0.61–1.24)
Creatinine, Ser: 0.7 mg/dL (ref 0.61–1.24)
Creatinine, Ser: 0.7 mg/dL (ref 0.61–1.24)
Creatinine, Ser: 0.7 mg/dL (ref 0.61–1.24)
Glucose, Bld: 127 mg/dL — ABNORMAL HIGH (ref 70–99)
Glucose, Bld: 93 mg/dL (ref 70–99)
Glucose, Bld: 113 mg/dL — ABNORMAL HIGH (ref 70–99)
Glucose, Bld: 130 mg/dL — ABNORMAL HIGH (ref 70–99)
Glucose, Bld: 128 mg/dL — ABNORMAL HIGH (ref 70–99)
Glucose, Bld: 103 mg/dL — ABNORMAL HIGH (ref 70–99)
HCT: 36 % — ABNORMAL LOW (ref 39.0–52.0)
HCT: 31 % — ABNORMAL LOW (ref 39.0–52.0)
HCT: 28 % — ABNORMAL LOW (ref 39.0–52.0)
HCT: 28 % — ABNORMAL LOW (ref 39.0–52.0)
HCT: 31 % — ABNORMAL LOW (ref 39.0–52.0)
HCT: 28 % — ABNORMAL LOW (ref 39.0–52.0)
Hemoglobin: 12.2 g/dL — ABNORMAL LOW (ref 13.0–17.0)
Hemoglobin: 10.5 g/dL — ABNORMAL LOW (ref 13.0–17.0)
Hemoglobin: 9.5 g/dL — ABNORMAL LOW (ref 13.0–17.0)
Hemoglobin: 9.5 g/dL — ABNORMAL LOW (ref 13.0–17.0)
Hemoglobin: 10.5 g/dL — ABNORMAL LOW (ref 13.0–17.0)
Hemoglobin: 9.5 g/dL — ABNORMAL LOW (ref 13.0–17.0)
Potassium: 4.1 mmol/L (ref 3.5–5.1)
Potassium: 3.9 mmol/L (ref 3.5–5.1)
Potassium: 5.2 mmol/L — ABNORMAL HIGH (ref 3.5–5.1)
Potassium: 5.1 mmol/L (ref 3.5–5.1)
Potassium: 5.8 mmol/L — ABNORMAL HIGH (ref 3.5–5.1)
Potassium: 4.8 mmol/L (ref 3.5–5.1)
Sodium: 135 mmol/L (ref 135–145)
Sodium: 139 mmol/L (ref 135–145)
Sodium: 137 mmol/L (ref 135–145)
Sodium: 135 mmol/L (ref 135–145)
Sodium: 135 mmol/L (ref 135–145)
Sodium: 139 mmol/L (ref 135–145)
TCO2: 24 mmol/L (ref 22–32)
TCO2: 21 mmol/L — ABNORMAL LOW (ref 22–32)
TCO2: 25 mmol/L (ref 22–32)
TCO2: 25 mmol/L (ref 22–32)
TCO2: 24 mmol/L (ref 22–32)
TCO2: 22 mmol/L (ref 22–32)

## 2023-09-21 LAB — GLUCOSE, CAPILLARY
Glucose-Capillary: 102 mg/dL — ABNORMAL HIGH (ref 70–99)
Glucose-Capillary: 77 mg/dL (ref 70–99)
Glucose-Capillary: 77 mg/dL (ref 70–99)
Glucose-Capillary: 99 mg/dL (ref 70–99)
Glucose-Capillary: 87 mg/dL (ref 70–99)
Glucose-Capillary: 88 mg/dL (ref 70–99)
Glucose-Capillary: 81 mg/dL (ref 70–99)
Glucose-Capillary: 83 mg/dL (ref 70–99)
Glucose-Capillary: 92 mg/dL (ref 70–99)

## 2023-09-21 LAB — POCT I-STAT EG7
Acid-base deficit: 1 mmol/L (ref 0.0–2.0)
Bicarbonate: 26 mmol/L (ref 20.0–28.0)
Calcium, Ion: 1.12 mmol/L — ABNORMAL LOW (ref 1.15–1.40)
HCT: 27 % — ABNORMAL LOW (ref 39.0–52.0)
Hemoglobin: 9.2 g/dL — ABNORMAL LOW (ref 13.0–17.0)
O2 Saturation: 71 %
Potassium: 6.2 mmol/L — ABNORMAL HIGH (ref 3.5–5.1)
Sodium: 136 mmol/L (ref 135–145)
TCO2: 28 mmol/L (ref 22–32)
pCO2, Ven: 53.6 mmHg (ref 44–60)
pH, Ven: 7.294 (ref 7.25–7.43)
pO2, Ven: 42 mmHg (ref 32–45)

## 2023-09-21 LAB — BLOOD GAS, ARTERIAL
Acid-Base Excess: 1.7 mmol/L (ref 0.0–2.0)
Bicarbonate: 26.6 mmol/L (ref 20.0–28.0)
Drawn by: 72085
O2 Saturation: 98 %
Patient temperature: 36.6
pCO2 arterial: 41 mmHg (ref 32–48)
pH, Arterial: 7.42 (ref 7.35–7.45)
pO2, Arterial: 76 mmHg — ABNORMAL LOW (ref 83–108)

## 2023-09-21 LAB — HEMOGLOBIN AND HEMATOCRIT, BLOOD
HCT: 29.8 % — ABNORMAL LOW (ref 39.0–52.0)
Hemoglobin: 10.1 g/dL — ABNORMAL LOW (ref 13.0–17.0)

## 2023-09-21 LAB — LIPOPROTEIN A (LPA): Lipoprotein (a): 113.3 nmol/L — ABNORMAL HIGH (ref <75.0)

## 2023-09-21 LAB — ECHO INTRAOPERATIVE TEE
AV Mean grad: 17 mmHg
Height: 72 in
Weight: 3465.6 [oz_av]

## 2023-09-21 LAB — MAGNESIUM: Magnesium: 2.4 mg/dL (ref 1.7–2.4)

## 2023-09-21 LAB — PLATELET COUNT: Platelets: 208 10*3/uL (ref 150–400)

## 2023-09-21 LAB — PROTIME-INR
INR: 1.4 — ABNORMAL HIGH (ref 0.8–1.2)
Prothrombin Time: 17.5 s — ABNORMAL HIGH (ref 11.4–15.2)

## 2023-09-21 LAB — APTT: aPTT: 38 s — ABNORMAL HIGH (ref 24–36)

## 2023-09-21 SURGERY — CORONARY ARTERY BYPASS GRAFTING (CABG)
Anesthesia: General | Site: Chest

## 2023-09-21 MED ORDER — ACETAMINOPHEN 160 MG/5ML PO SOLN
650.0000 mg | Freq: Once | ORAL | Status: AC
  Administered 2023-09-21: 650 mg
  Filled 2023-09-21: qty 20.3

## 2023-09-21 MED ORDER — BISACODYL 10 MG RE SUPP: 10.0000 mg | Freq: Every day | RECTAL | Status: DC

## 2023-09-21 MED ORDER — BISACODYL 5 MG PO TBEC
10.0000 mg | DELAYED_RELEASE_TABLET | Freq: Every day | ORAL | Status: DC
  Administered 2023-09-22 – 2023-09-25 (×4): 10 mg via ORAL
  Filled 2023-09-21 (×4): qty 2

## 2023-09-21 MED ORDER — INSULIN REGULAR(HUMAN) IN NACL 100-0.9 UT/100ML-% IV SOLN: INTRAVENOUS | Status: DC

## 2023-09-21 MED ORDER — SODIUM CHLORIDE (PF) 0.9 % IJ SOLN
OROMUCOSAL | Status: DC | PRN
  Administered 2023-09-21: 4 mL via TOPICAL

## 2023-09-21 MED ORDER — PANTOPRAZOLE SODIUM 40 MG IV SOLR
40.0000 mg | Freq: Every day | INTRAVENOUS | Status: AC
  Administered 2023-09-21 – 2023-09-22 (×2): 40 mg via INTRAVENOUS
  Filled 2023-09-21 (×2): qty 10

## 2023-09-21 MED ORDER — PLASMA-LYTE A IV SOLN
INTRAVENOUS | Status: DC | PRN
  Administered 2023-09-21: 500 mL via INTRAVASCULAR

## 2023-09-21 MED ORDER — ONDANSETRON HCL 4 MG/2ML IJ SOLN
INTRAMUSCULAR | Status: AC
  Filled 2023-09-21: qty 4

## 2023-09-21 MED ORDER — DEXMEDETOMIDINE HCL IN NACL 400 MCG/100ML IV SOLN
0.0000 ug/kg/h | INTRAVENOUS | Status: DC
  Administered 2023-09-21: 0.7 ug/kg/h via INTRAVENOUS

## 2023-09-21 MED ORDER — POTASSIUM CHLORIDE 10 MEQ/50ML IV SOLN: 10.0000 meq | INTRAVENOUS | Status: AC

## 2023-09-21 MED ORDER — SODIUM CHLORIDE 0.9% FLUSH
3.0000 mL | Freq: Two times a day (BID) | INTRAVENOUS | Status: DC
  Administered 2023-09-22 – 2023-09-25 (×7): 3 mL via INTRAVENOUS

## 2023-09-21 MED ORDER — ONDANSETRON HCL 4 MG/2ML IJ SOLN
INTRAMUSCULAR | Status: AC
  Filled 2023-09-21: qty 2

## 2023-09-21 MED ORDER — MIDAZOLAM HCL (PF) 5 MG/ML IJ SOLN
INTRAMUSCULAR | Status: DC | PRN
  Administered 2023-09-21: 1 mg via INTRAVENOUS
  Administered 2023-09-21: 2 mg via INTRAVENOUS

## 2023-09-21 MED ORDER — SODIUM BICARBONATE 8.4 % IV SOLN
50.0000 meq | Freq: Once | INTRAVENOUS | Status: AC
  Administered 2023-09-21: 50 meq via INTRAVENOUS

## 2023-09-21 MED ORDER — FENTANYL CITRATE (PF) 250 MCG/5ML IJ SOLN
INTRAMUSCULAR | Status: DC | PRN
  Administered 2023-09-21: 50 ug via INTRAVENOUS
  Administered 2023-09-21: 100 ug via INTRAVENOUS

## 2023-09-21 MED ORDER — LACTATED RINGERS IV SOLN: INTRAVENOUS | Status: DC

## 2023-09-21 MED ORDER — ASPIRIN 325 MG PO TBEC
325.0000 mg | DELAYED_RELEASE_TABLET | Freq: Every day | ORAL | Status: DC
  Administered 2023-09-22 – 2023-09-25 (×4): 325 mg via ORAL
  Filled 2023-09-21 (×4): qty 1

## 2023-09-21 MED ORDER — SUGAMMADEX SODIUM 200 MG/2ML IV SOLN
INTRAVENOUS | Status: DC | PRN
  Administered 2023-09-21: 196.4 mg via INTRAVENOUS

## 2023-09-21 MED ORDER — PHENYLEPHRINE HCL-NACL 20-0.9 MG/250ML-% IV SOLN: 0.0000 ug/min | INTRAVENOUS | Status: DC

## 2023-09-21 MED ORDER — SODIUM CHLORIDE 0.9 % IV SOLN: INTRAVENOUS | Status: AC

## 2023-09-21 MED ORDER — INSULIN ASPART 100 UNIT/ML IJ SOLN
0.0000 [IU] | INTRAMUSCULAR | Status: DC
  Administered 2023-09-22 – 2023-09-23 (×5): 2 [IU] via SUBCUTANEOUS

## 2023-09-21 MED ORDER — METOPROLOL TARTRATE 25 MG/10 ML ORAL SUSPENSION: 12.5000 mg | Freq: Two times a day (BID) | ORAL | Status: DC

## 2023-09-21 MED ORDER — CHLORHEXIDINE GLUCONATE CLOTH 2 % EX PADS
6.0000 | MEDICATED_PAD | Freq: Every day | CUTANEOUS | Status: DC
Start: 2023-09-21 — End: 2023-09-25
  Administered 2023-09-21 – 2023-09-25 (×5): 6 via TOPICAL

## 2023-09-21 MED ORDER — NITROGLYCERIN IN D5W 200-5 MCG/ML-% IV SOLN: 0.0000 ug/min | INTRAVENOUS | Status: DC

## 2023-09-21 MED ORDER — MIDAZOLAM HCL (PF) 10 MG/2ML IJ SOLN
INTRAMUSCULAR | Status: AC
  Filled 2023-09-21: qty 2

## 2023-09-21 MED ORDER — FENTANYL CITRATE (PF) 250 MCG/5ML IJ SOLN
INTRAMUSCULAR | Status: AC
  Filled 2023-09-21: qty 5

## 2023-09-21 MED ORDER — NICARDIPINE HCL IN NACL 20-0.86 MG/200ML-% IV SOLN
0.0000 mg/h | INTRAVENOUS | Status: DC
  Administered 2023-09-21: 5 mg/h via INTRAVENOUS
  Filled 2023-09-21: qty 200

## 2023-09-21 MED ORDER — HEPARIN SODIUM (PORCINE) 1000 UNIT/ML IJ SOLN
INTRAMUSCULAR | Status: DC | PRN
  Administered 2023-09-21: 31000 [IU] via INTRAVENOUS

## 2023-09-21 MED ORDER — EPINEPHRINE HCL 5 MG/250ML IV SOLN IN NS: 0.0000 ug/min | INTRAVENOUS | Status: DC

## 2023-09-21 MED ORDER — PROPOFOL 10 MG/ML IV BOLUS
INTRAVENOUS | Status: AC
  Filled 2023-09-21: qty 20

## 2023-09-21 MED ORDER — PROTAMINE SULFATE 10 MG/ML IV SOLN
INTRAVENOUS | Status: AC
  Filled 2023-09-21: qty 25

## 2023-09-21 MED ORDER — HEPARIN SODIUM (PORCINE) 1000 UNIT/ML IJ SOLN
INTRAMUSCULAR | Status: AC
  Filled 2023-09-21: qty 1

## 2023-09-21 MED ORDER — DOCUSATE SODIUM 100 MG PO CAPS
200.0000 mg | ORAL_CAPSULE | Freq: Every day | ORAL | Status: DC
  Administered 2023-09-22 – 2023-09-25 (×4): 200 mg via ORAL
  Filled 2023-09-21 (×4): qty 2

## 2023-09-21 MED ORDER — ONDANSETRON HCL 4 MG/2ML IJ SOLN
INTRAMUSCULAR | Status: DC | PRN
  Administered 2023-09-21: 8 mg via INTRAVENOUS

## 2023-09-21 MED ORDER — ONDANSETRON HCL 4 MG/2ML IJ SOLN: 4.0000 mg | Freq: Four times a day (QID) | INTRAMUSCULAR | Status: DC | PRN

## 2023-09-21 MED ORDER — LACTATED RINGERS IV SOLN: INTRAVENOUS | Status: AC

## 2023-09-21 MED ORDER — ALBUMIN HUMAN 5 % IV SOLN
250.0000 mL | INTRAVENOUS | Status: AC | PRN
  Administered 2023-09-21 (×5): 12.5 g via INTRAVENOUS
  Filled 2023-09-21 (×3): qty 250

## 2023-09-21 MED ORDER — HYDROCODONE-ACETAMINOPHEN 10-325 MG PO TABS: 1.0000 | ORAL_TABLET | ORAL | Status: DC | PRN

## 2023-09-21 MED ORDER — ASPIRIN 81 MG PO CHEW: 324.0000 mg | CHEWABLE_TABLET | Freq: Every day | ORAL | Status: DC

## 2023-09-21 MED ORDER — EPHEDRINE 5 MG/ML INJ
INTRAVENOUS | Status: AC
  Filled 2023-09-21: qty 5

## 2023-09-21 MED ORDER — MAGNESIUM SULFATE 4 GM/100ML IV SOLN
4.0000 g | Freq: Once | INTRAVENOUS | Status: AC
  Administered 2023-09-21: 4 g via INTRAVENOUS
  Filled 2023-09-21: qty 100

## 2023-09-21 MED ORDER — METOPROLOL TARTRATE 12.5 MG HALF TABLET
12.5000 mg | ORAL_TABLET | Freq: Two times a day (BID) | ORAL | Status: DC
  Administered 2023-09-21: 12.5 mg via ORAL
  Filled 2023-09-21: qty 1

## 2023-09-21 MED ORDER — METHADONE HCL IV SYRINGE 10 MG/ML FOR CABG
0.4000 mg/kg | Freq: Once | INTRAMUSCULAR | Status: AC
  Administered 2023-09-21: 31 mg via INTRAVENOUS
  Filled 2023-09-21: qty 3.1

## 2023-09-21 MED ORDER — METOPROLOL TARTRATE 5 MG/5ML IV SOLN
2.5000 mg | INTRAVENOUS | Status: DC | PRN
  Administered 2023-09-22: 5 mg via INTRAVENOUS
  Filled 2023-09-21: qty 5

## 2023-09-21 MED ORDER — CEFAZOLIN SODIUM-DEXTROSE 2-4 GM/100ML-% IV SOLN
2.0000 g | Freq: Three times a day (TID) | INTRAVENOUS | Status: AC
  Administered 2023-09-21 – 2023-09-23 (×6): 2 g via INTRAVENOUS
  Filled 2023-09-21 (×6): qty 100

## 2023-09-21 MED ORDER — ACETAMINOPHEN 160 MG/5ML PO SOLN: 650.0000 mg | Freq: Four times a day (QID) | ORAL | Status: DC

## 2023-09-21 MED ORDER — STERILE WATER FOR IRRIGATION IR SOLN
Status: DC | PRN
  Administered 2023-09-21: 2000 mL

## 2023-09-21 MED ORDER — LACTATED RINGERS IV SOLN: INTRAVENOUS | Status: DC | PRN

## 2023-09-21 MED ORDER — ORAL CARE MOUTH RINSE: 15.0000 mL | Freq: Once | OROMUCOSAL | Status: AC

## 2023-09-21 MED ORDER — DEXAMETHASONE SODIUM PHOSPHATE 10 MG/ML IJ SOLN
INTRAMUSCULAR | Status: AC
  Filled 2023-09-21: qty 1

## 2023-09-21 MED ORDER — SODIUM CHLORIDE 0.9 % IV SOLN: 250.0000 mL | INTRAVENOUS | Status: AC

## 2023-09-21 MED ORDER — CHLORHEXIDINE GLUCONATE 0.12 % MT SOLN
OROMUCOSAL | Status: AC
  Administered 2023-09-21: 15 mL via OROMUCOSAL
  Filled 2023-09-21: qty 15

## 2023-09-21 MED ORDER — ROCURONIUM BROMIDE 10 MG/ML (PF) SYRINGE
PREFILLED_SYRINGE | INTRAVENOUS | Status: AC
  Filled 2023-09-21: qty 10

## 2023-09-21 MED ORDER — SODIUM CHLORIDE 0.9% FLUSH: 3.0000 mL | INTRAVENOUS | Status: DC | PRN

## 2023-09-21 MED ORDER — VANCOMYCIN HCL IN DEXTROSE 1-5 GM/200ML-% IV SOLN
1000.0000 mg | Freq: Once | INTRAVENOUS | Status: AC
  Administered 2023-09-21: 1000 mg via INTRAVENOUS
  Filled 2023-09-21: qty 200

## 2023-09-21 MED ORDER — 0.9 % SODIUM CHLORIDE (POUR BTL) OPTIME
TOPICAL | Status: DC | PRN
  Administered 2023-09-21 (×2): 1000 mL
  Administered 2023-09-21: 5000 mL

## 2023-09-21 MED ORDER — SODIUM BICARBONATE 8.4 % IV SOLN
50.0000 meq | Freq: Once | INTRAVENOUS | Status: AC
  Administered 2023-09-21: 50 meq via INTRAVENOUS
  Filled 2023-09-21: qty 50

## 2023-09-21 MED ORDER — MIDAZOLAM HCL 2 MG/2ML IJ SOLN
2.0000 mg | INTRAMUSCULAR | Status: DC | PRN
  Filled 2023-09-21: qty 2

## 2023-09-21 MED ORDER — ACETAMINOPHEN 325 MG PO TABS
650.0000 mg | ORAL_TABLET | Freq: Four times a day (QID) | ORAL | Status: DC
  Administered 2023-09-22 – 2023-09-25 (×14): 650 mg via ORAL
  Filled 2023-09-21 (×14): qty 2

## 2023-09-21 MED ORDER — ALBUMIN HUMAN 5 % IV SOLN: INTRAVENOUS | Status: DC | PRN

## 2023-09-21 MED ORDER — OXYCODONE HCL 5 MG PO TABS
5.0000 mg | ORAL_TABLET | Freq: Four times a day (QID) | ORAL | Status: DC | PRN
  Administered 2023-09-22 – 2023-09-25 (×9): 10 mg via ORAL
  Filled 2023-09-21 (×9): qty 2

## 2023-09-21 MED ORDER — MORPHINE SULFATE (PF) 2 MG/ML IV SOLN
1.0000 mg | INTRAVENOUS | Status: DC | PRN
  Administered 2023-09-21 (×2): 2 mg via INTRAVENOUS
  Administered 2023-09-21: 3 mg via INTRAVENOUS
  Administered 2023-09-21: 2 mg via INTRAVENOUS
  Administered 2023-09-21 – 2023-09-24 (×9): 4 mg via INTRAVENOUS
  Administered 2023-09-24: 2 mg via INTRAVENOUS
  Filled 2023-09-21 (×5): qty 2
  Filled 2023-09-21 (×2): qty 1
  Filled 2023-09-21 (×3): qty 2
  Filled 2023-09-21: qty 1
  Filled 2023-09-21 (×5): qty 2

## 2023-09-21 MED ORDER — ROCURONIUM BROMIDE 10 MG/ML (PF) SYRINGE
PREFILLED_SYRINGE | INTRAVENOUS | Status: DC | PRN
  Administered 2023-09-21: 100 mg via INTRAVENOUS
  Administered 2023-09-21: 20 mg via INTRAVENOUS
  Administered 2023-09-21: 100 mg via INTRAVENOUS

## 2023-09-21 MED ORDER — CHLORHEXIDINE GLUCONATE 0.12 % MT SOLN
15.0000 mL | OROMUCOSAL | Status: AC
  Administered 2023-09-21: 15 mL via OROMUCOSAL
  Filled 2023-09-21: qty 15

## 2023-09-21 MED ORDER — PROTAMINE SULFATE 10 MG/ML IV SOLN
INTRAVENOUS | Status: DC | PRN
  Administered 2023-09-21: 300 mg via INTRAVENOUS

## 2023-09-21 MED ORDER — ROCURONIUM BROMIDE 10 MG/ML (PF) SYRINGE
PREFILLED_SYRINGE | INTRAVENOUS | Status: AC
Start: 2023-09-21 — End: ?
  Filled 2023-09-21: qty 20

## 2023-09-21 MED ORDER — METOCLOPRAMIDE HCL 5 MG/ML IJ SOLN
10.0000 mg | Freq: Four times a day (QID) | INTRAMUSCULAR | Status: AC
  Administered 2023-09-21 – 2023-09-22 (×5): 10 mg via INTRAVENOUS
  Filled 2023-09-21 (×5): qty 2

## 2023-09-21 MED ORDER — PHENYLEPHRINE 80 MCG/ML (10ML) SYRINGE FOR IV PUSH (FOR BLOOD PRESSURE SUPPORT)
PREFILLED_SYRINGE | INTRAVENOUS | Status: AC
Start: 2023-09-21 — End: ?
  Filled 2023-09-21: qty 20

## 2023-09-21 MED ORDER — PHENYLEPHRINE 80 MCG/ML (10ML) SYRINGE FOR IV PUSH (FOR BLOOD PRESSURE SUPPORT)
PREFILLED_SYRINGE | INTRAVENOUS | Status: DC | PRN
  Administered 2023-09-21 (×2): 80 ug via INTRAVENOUS
  Administered 2023-09-21: 40 ug via INTRAVENOUS

## 2023-09-21 MED ORDER — NITROGLYCERIN 0.2 MG/ML ON CALL CATH LAB
INTRAVENOUS | Status: DC | PRN
  Administered 2023-09-21: 10 ug via INTRAVENOUS
  Administered 2023-09-21: 20 ug via INTRAVENOUS
  Administered 2023-09-21 (×3): 10 ug via INTRAVENOUS

## 2023-09-21 MED ORDER — PANTOPRAZOLE SODIUM 40 MG PO TBEC
40.0000 mg | DELAYED_RELEASE_TABLET | Freq: Every day | ORAL | Status: DC
  Administered 2023-09-23 – 2023-09-25 (×3): 40 mg via ORAL
  Filled 2023-09-21 (×3): qty 1

## 2023-09-21 MED ORDER — CHLORHEXIDINE GLUCONATE 0.12 % MT SOLN: 15.0000 mL | Freq: Once | OROMUCOSAL | Status: AC

## 2023-09-21 MED ORDER — ASPIRIN 81 MG PO CHEW
324.0000 mg | CHEWABLE_TABLET | Freq: Once | ORAL | Status: AC
  Administered 2023-09-21: 324 mg via ORAL
  Filled 2023-09-21: qty 4

## 2023-09-21 MED ORDER — SODIUM CHLORIDE 0.45 % IV SOLN: INTRAVENOUS | Status: AC | PRN

## 2023-09-21 MED ORDER — SODIUM CHLORIDE 0.9 % IV SOLN: INTRAVENOUS | Status: DC | PRN

## 2023-09-21 MED ORDER — DEXTROSE 50 % IV SOLN: 0.0000 mL | INTRAVENOUS | Status: DC | PRN

## 2023-09-21 MED ORDER — PROPOFOL 10 MG/ML IV BOLUS
INTRAVENOUS | Status: DC | PRN
  Administered 2023-09-21: 130 mg via INTRAVENOUS

## 2023-09-21 SURGICAL SUPPLY — 100 items
ADAPTER CARDIO PERF ANTE/RETRO (ADAPTER) ×2 IMPLANT
ANTIFOG SOL W/FOAM PAD STRL (MISCELLANEOUS) ×2 IMPLANT
BAG DECANTER FOR FLEXI CONT (MISCELLANEOUS) ×2 IMPLANT
BLADE CLIPPER SURG (BLADE) ×2 IMPLANT
BLADE STERNUM SYSTEM 6 (BLADE) ×2 IMPLANT
BLADE SURG 11 STRL SS (BLADE) IMPLANT
BLADE SURG 15 STRL LF DISP TIS (BLADE) ×2 IMPLANT
BNDG ELASTIC 4INX 5YD STR LF (GAUZE/BANDAGES/DRESSINGS) IMPLANT
BNDG ELASTIC 4X5.8 VLCR STR LF (GAUZE/BANDAGES/DRESSINGS) ×2 IMPLANT
BNDG ELASTIC 6INX 5YD STR LF (GAUZE/BANDAGES/DRESSINGS) ×2 IMPLANT
BNDG GAUZE DERMACEA FLUFF 4 (GAUZE/BANDAGES/DRESSINGS) ×2 IMPLANT
CABLE SURGICAL S-101-97-12 (CABLE) ×2 IMPLANT
CANISTER SUCT 3000ML PPV (MISCELLANEOUS) ×2 IMPLANT
CANNULA MC2 2 STG 29/37 NON-V (CANNULA) ×2 IMPLANT
CANNULA NON VENT 20FR 12 (CANNULA) ×2 IMPLANT
CANNULA SUMP PERICARDIAL (CANNULA) IMPLANT
CATH HEART VENT LEFT (CATHETERS) ×2 IMPLANT
CATH ROBINSON RED A/P 18FR (CATHETERS) ×6 IMPLANT
CLIP RETRACTION 3.0MM CORONARY (MISCELLANEOUS) IMPLANT
CLIP TI MEDIUM 24 (CLIP) IMPLANT
CLIP TI WIDE RED SMALL 24 (CLIP) IMPLANT
CNTNR URN SCR LID CUP LEK RST (MISCELLANEOUS) ×2 IMPLANT
CONN ST 1/2X1/2 BEN (MISCELLANEOUS) ×2 IMPLANT
CONNECTOR BLAKE 2:1 CARIO BLK (MISCELLANEOUS) ×2 IMPLANT
CONTAINER PROTECT SURGISLUSH (MISCELLANEOUS) ×4 IMPLANT
COVER SURGICAL LIGHT HANDLE (MISCELLANEOUS) ×2 IMPLANT
DERMABOND ADVANCED .7 DNX12 (GAUZE/BANDAGES/DRESSINGS) IMPLANT
DEVICE SUT CK QUICK LOAD INDV (Prosthesis & Implant Heart) IMPLANT
DEVICE SUT CK QUICK LOAD MINI (Prosthesis & Implant Heart) IMPLANT
DRAIN CHANNEL 19F RND (DRAIN) ×6 IMPLANT
DRAIN CONNECTOR BLAKE 1:1 (MISCELLANEOUS) ×2 IMPLANT
DRAPE INCISE IOBAN 66X45 STRL (DRAPES) IMPLANT
DRAPE SRG 135X102X78XABS (DRAPES) ×2 IMPLANT
DRAPE WARM FLUID 44X44 (DRAPES) ×2 IMPLANT
DRSG AQUACEL AG ADV 3.5X10 (GAUZE/BANDAGES/DRESSINGS) ×2 IMPLANT
ELECT BLADE 4.0 EZ CLEAN MEGAD (MISCELLANEOUS) ×2 IMPLANT
ELECT CAUTERY BLADE 6.4 (BLADE) ×2 IMPLANT
ELECT REM PT RETURN 9FT ADLT (ELECTROSURGICAL) ×4 IMPLANT
ELECTRODE BLDE 4.0 EZ CLN MEGD (MISCELLANEOUS) ×2 IMPLANT
ELECTRODE REM PT RTRN 9FT ADLT (ELECTROSURGICAL) ×4 IMPLANT
FELT TEFLON 1X6 (MISCELLANEOUS) ×4 IMPLANT
GAUZE SPONGE 4X4 12PLY STRL (GAUZE/BANDAGES/DRESSINGS) ×4 IMPLANT
GLOVE BIO SURGEON STRL SZ 6 (GLOVE) IMPLANT
GLOVE BIO SURGEON STRL SZ 6.5 (GLOVE) IMPLANT
GLOVE BIO SURGEON STRL SZ7 (GLOVE) ×4 IMPLANT
GLOVE BIO SURGEON STRL SZ7.5 (GLOVE) IMPLANT
GLOVE BIOGEL M STRL SZ7.5 (GLOVE) ×4 IMPLANT
GLOVE BIOGEL PI IND STRL 7.0 (GLOVE) IMPLANT
GOWN STRL REUS W/ TWL LRG LVL3 (GOWN DISPOSABLE) ×8 IMPLANT
GOWN STRL REUS W/ TWL XL LVL3 (GOWN DISPOSABLE) ×4 IMPLANT
HEMOSTAT POWDER SURGIFOAM 1G (HEMOSTASIS) ×4 IMPLANT
INSERT SUTURE HOLDER (MISCELLANEOUS) ×2 IMPLANT
KIT BASIN OR (CUSTOM PROCEDURE TRAY) ×2 IMPLANT
KIT SUCTION CATH 14FR (SUCTIONS) ×2 IMPLANT
KIT SUT CK MINI COMBO 4X17 (Prosthesis & Implant Heart) IMPLANT
KIT TURNOVER KIT B (KITS) ×2 IMPLANT
KIT VASOVIEW HEMOPRO 2 VH 4000 (KITS) ×2 IMPLANT
LEAD PACING MYOCARDI (MISCELLANEOUS) ×2 IMPLANT
LINE VENT (MISCELLANEOUS) IMPLANT
MARKER GRAFT CORONARY BYPASS (MISCELLANEOUS) ×6 IMPLANT
NS IRRIG 1000ML POUR BTL (IV SOLUTION) ×10 IMPLANT
ORGANIZER SUTURE GABBAY-FRATER (MISCELLANEOUS) ×2 IMPLANT
PACK E OPEN HEART (SUTURE) ×2 IMPLANT
PACK OPEN HEART (CUSTOM PROCEDURE TRAY) ×2 IMPLANT
PAD ARMBOARD POSITIONER FOAM (MISCELLANEOUS) ×4 IMPLANT
PAD ELECT DEFIB RADIOL ZOLL (MISCELLANEOUS) ×2 IMPLANT
PENCIL BUTTON HOLSTER BLD 10FT (ELECTRODE) ×2 IMPLANT
POSITIONER HEAD DONUT 9IN (MISCELLANEOUS) ×2 IMPLANT
PUNCH AORTIC ROTATE 4.0MM (MISCELLANEOUS) ×2 IMPLANT
SET MPS 3-ND DEL (MISCELLANEOUS) IMPLANT
SOLUTION ANTFG W/FOAM PAD STRL (MISCELLANEOUS) IMPLANT
SPONGE T-LAP 18X18 ~~LOC~~+RFID (SPONGE) ×8 IMPLANT
STOPCOCK 4 WAY LG BORE MALE ST (IV SETS) IMPLANT
SUPPORT HEART JANKE-BARRON (MISCELLANEOUS) ×2 IMPLANT
SUT BONE WAX W31G (SUTURE) ×2 IMPLANT
SUT EB EXC GRN/WHT 2-0 V-5 (SUTURE) ×4 IMPLANT
SUT ETHIBOND X763 2 0 SH 1 (SUTURE) ×4 IMPLANT
SUT ETHILON 3 0 PS 1 (SUTURE) IMPLANT
SUT MNCRL AB 3-0 PS2 18 (SUTURE) ×4 IMPLANT
SUT MNCRL AB 4-0 PS2 18 (SUTURE) IMPLANT
SUT PDS AB 1 CTX 36 (SUTURE) ×4 IMPLANT
SUT PROLENE 3 0 SH1 36 (SUTURE) ×2 IMPLANT
SUT PROLENE 4-0 RB1 .5 CRCL 36 (SUTURE) ×6 IMPLANT
SUT PROLENE 5 0 C 1 36 (SUTURE) ×6 IMPLANT
SUT PROLENE 7 0 BV 1 (SUTURE) IMPLANT
SUT PROLENE 7 0 BV1 MDA (SUTURE) ×2 IMPLANT
SUT STEEL STERNAL CCS#1 18IN (SUTURE) IMPLANT
SUT VIC AB 2-0 CT1 TAPERPNT 27 (SUTURE) IMPLANT
SYSTEM SAHARA CHEST DRAIN ATS (WOUND CARE) ×2 IMPLANT
TAPE CLOTH SOFT 2X10 (GAUZE/BANDAGES/DRESSINGS) IMPLANT
TAPE PAPER 2X10 WHT MICROPORE (GAUZE/BANDAGES/DRESSINGS) IMPLANT
TOWEL GREEN STERILE (TOWEL DISPOSABLE) ×2 IMPLANT
TOWEL GREEN STERILE FF (TOWEL DISPOSABLE) ×2 IMPLANT
TRAY FOLEY SLVR 16FR TEMP STAT (SET/KITS/TRAYS/PACK) ×2 IMPLANT
TUBE SUCT INTRACARD DLP 20F (MISCELLANEOUS) IMPLANT
TUBING LAP HI FLOW INSUFFLATIO (TUBING) ×2 IMPLANT
UNDERPAD 30X36 HEAVY ABSORB (UNDERPADS AND DIAPERS) ×2 IMPLANT
VALVE AORTIC SZ21 INSP/RESIL (Valve) IMPLANT
VENT LEFT HEART 12002 (CATHETERS) ×2 IMPLANT
WATER STERILE IRR 1000ML POUR (IV SOLUTION) ×4 IMPLANT

## 2023-09-21 NOTE — Anesthesia Procedure Notes (Signed)
 Central Venous Catheter Insertion Performed by: Beryle Lathe, MD, anesthesiologist Start/End3/26/2025 7:05 AM, 09/21/2023 7:15 AM Preanesthetic checklist: patient identified, IV checked, risks and benefits discussed, surgical consent, monitors and equipment checked, pre-op evaluation, timeout performed and anesthesia consent Position: Trendelenburg Lidocaine 1% used for infiltration and patient sedated Hand hygiene performed , maximum sterile barriers used  and Seldinger technique used Catheter size: 8.5 Fr Central line was placed.Sheath introducer Procedure performed using ultrasound guided technique. Ultrasound Notes:anatomy identified, needle tip was noted to be adjacent to the nerve/plexus identified, no ultrasound evidence of intravascular and/or intraneural injection and image(s) printed for medical record Attempts: 1 Following insertion, line sutured, dressing applied and Biopatch. Post procedure assessment: blood return through all ports, free fluid flow and no air  Patient tolerated the procedure well with no immediate complications. Additional procedure comments: Triple port SLIC placed via Swan port .

## 2023-09-21 NOTE — Anesthesia Procedure Notes (Addendum)
 Procedure Name: Intubation Date/Time: 09/21/2023 7:53 AM  Performed by: Darryl Nestle, CRNAPre-anesthesia Checklist: Patient identified, Emergency Drugs available, Suction available and Patient being monitored Patient Re-evaluated:Patient Re-evaluated prior to induction Oxygen Delivery Method: Circle system utilized Preoxygenation: Pre-oxygenation with 100% oxygen Induction Type: IV induction Ventilation: Mask ventilation without difficulty Laryngoscope Size: Mac and 4 Grade View: Grade I Tube type: Oral Tube size: 8.0 mm Number of attempts: 1 Airway Equipment and Method: Stylet and Oral airway Placement Confirmation: ETT inserted through vocal cords under direct vision, positive ETCO2 and breath sounds checked- equal and bilateral Secured at: 24 cm Tube secured with: Tape Dental Injury: Teeth and Oropharynx as per pre-operative assessment  Comments: I. Kolencherry srna

## 2023-09-21 NOTE — Anesthesia Procedure Notes (Signed)
 Arterial Line Insertion Start/End3/26/2025 6:30 AM, 09/21/2023 7:00 AM Performed by: Darryl Nestle, CRNA, CRNA  Patient location: Pre-op. Preanesthetic checklist: patient identified, IV checked, site marked, risks and benefits discussed, surgical consent, monitors and equipment checked, pre-op evaluation, timeout performed and anesthesia consent Lidocaine 1% used for infiltration Left, radial was placed Catheter size: 20 G Hand hygiene performed  and maximum sterile barriers used   Attempts: 1 Procedure performed using ultrasound guided technique. Following insertion, dressing applied and Biopatch. Post procedure assessment: normal and unchanged  Patient tolerated the procedure well with no immediate complications.

## 2023-09-21 NOTE — Procedures (Signed)
 Extubation Procedure Note  Patient Details:   Name: Alan Grant DOB: January 29, 1956 MRN: [id number]   Airway Documentation:    Vent end date: 09/21/23 Vent end time: 1745   Evaluation  O2 sats: stable throughout Complications: No apparent complications Patient did tolerate procedure well. Bilateral Breath Sounds: Clear, Diminished   Yes  NIF -20 VC . Positive cuff leak prior to extubation. Pt placed on 4L [place]  Lajean Manes 09/21/2023, 5:51 PM

## 2023-09-21 NOTE — Consult Note (Signed)
 NAME:  Alan Grant, MRN:  [id number], DOB:  22-Jan-1956, LOS: 2 ADMISSION DATE:  09/19/2023, CONSULTATION DATE:  3/26 REFERRING MD:  Cliffton Asters, CHIEF COMPLAINT:  CABG   History of Present Illness:  Pt is 68 yr old male with significant pmhx of HTN, HLD, chronic back pain, and nicotine abuse who presented for outpatient cardiac cath due to worsening angina. LHC revealed 95% left main, 95% proximal circumflex, and 90% proximal LAD and TCTS was consulted. Patient was then scheduled and underwent a CABG x 4 and AVR on 09/19/23.  Pertinent  Medical History   Past Medical History:  Diagnosis Date   High cholesterol    Hypertension      Significant Hospital Events: Including procedures, antibiotic start and stop dates in addition to other pertinent events   3/26 PCCM Consult, CABG x4 and AVR   Interim History / Subjective:  Intubated, sedated, but following commands Chest tubes with minimal sanguineous output   Objective   Blood pressure 126/84, pulse 76, temperature 97.6 F (36.4 C), temperature source Oral, resp. rate 20, height 6' (1.829 m), weight 98.2 kg, SpO2 93%.        Intake/Output Summary (Last 24 hours) at 09/21/2023 1148 Last data filed at 09/21/2023 1130 Gross per 24 hour  Intake 960 ml  Output 800 ml  Net 160 ml   Filed Weights   09/19/23 0805 09/19/23 1552  Weight: 99.8 kg 98.2 kg    Examination: General: acute on chronic, lying in icu bed on vent  HEENT: Normocephalic, PERRLA intact, ETT, OG  Pink MM CV: s1,s2, MRG, No JVD, mediastinal chest tubes in place  pulm: clear, diminished, no distress on vent  Abs: bs active, soft  Extremities: no edema, no deformity, moves all extremities on command  Skin: no rash  Neuro: Rass -1 to -2, responds to painful stimuli, cough gag reflex present, follows commands GU: foley intact     Resolved Hospital Problem list   N/a   Assessment & Plan:  Multivessel CAD CABG x 4 -LIMA to LAD, SVG to OM, SVG to PDA SVG to  DIAGONAL and AVR  Post bypass vasoplegia HTN, HLD hx Echo 3/24: LVEF 60-65%, Normal LV function no regional wall abnormalities. Mild concentric LV hypertrophy RV normal function, mildly enlarged  LHC revealed 95% left main, 95% proximal circumflex, and 90% proximal LAD and TCTS was consulted P: Postoperative management per TCTS Postoperative care per TCTS CT management per protocol Wean pressors for map goal >65 Postoperative ancef/vanc for surgical ppx Continue ASA starting on 3/276  Continue STATIN  Wean insulin gtt per protocol Continue beta-blocker, patient off pressors, needing Cardene gtt for HTN, BP goal 100 to 140  Ensure adequate pain control- mediastinal drains per TCTS - multimodal pain control per protocol- oxycodone, tramadol, morphine, lidocaine patches with bowel regimen  Acute Post op vent management P: Rapid wean protocol per TCTS Continue ventilator support with lung protective strategies  Wean PEEP and FiO2 for sats greater than 90%. Head of bed elevated 30 degrees. Plateau pressures less than 30 cm H20.  Follow intermittent chest x-ray and ABG.   Ensure adequate pulmonary hygiene  VAP bundle in place  PAD protocol   Expected post-operative ABLA Expected post-operative consumptive thrombocytopenia  Hgb 10.1-stable  EBL:  P:  Transfuse if hgb < 7, continue to monitor CBC daily  Continue to monitor mediastinal chest tube output per protocol   Chronic Pain  P:  Continue pain management as above  Continue  Gabapentin 100mg  TID   Best Practice (right click and "Reselect all SmartList Selections" daily)   Diet/type: NPO DVT prophylaxis: SCD Pressure ulcer(s). N/a Lines: Central line Foley:  Yes, and it is still needed Code Status:  full code Last date of multidisciplinary goals of care discussion   Labs   CBC: Recent Labs  Lab 09/19/23 1704 09/21/23 0512 09/21/23 0810 09/21/23 0940 09/21/23 1007 09/21/23 1018 09/21/23 1045  09/21/23 1136  WBC 8.3 10.2  --   --   --   --   --   --   HGB 13.3 14.5   < > 10.5* 9.5* 9.2* 9.5* 9.5*  HCT 39.4 43.8   < > 31.0* 28.0* 27.0* 28.0* 28.0*  MCV 84.9 86.1  --   --   --   --   --   --   PLT 304 339  --   --   --   --   --   --    < > = values in this interval not displayed.    Basic Metabolic Panel: Recent Labs  Lab 09/21/23 0512 09/21/23 0810 09/21/23 0940 09/21/23 1007 09/21/23 1018 09/21/23 1045 09/21/23 1136  NA 136 135 139 140 136 137 135  K 4.5 4.1 3.9 3.9 6.2* 5.2* 5.1  CL 101 102 108  --   --  106 105  CO2 26  --   --   --   --   --   --   GLUCOSE 93 127* 93  --   --  113* 130*  BUN 15 16 13   --   --  14 15  CREATININE 1.01 0.80 0.70  --   --  0.70 0.70  CALCIUM 9.8  --   --   --   --   --   --    GFR: Estimated Creatinine Clearance: 110.2 mL/min (by C-G formula based on SCr of 0.7 mg/dL). Recent Labs  Lab 09/19/23 1704 09/21/23 0512  WBC 8.3 10.2    Liver Function Tests: No results for input(s): "AST", "ALT", "ALKPHOS", "BILITOT", "PROT", "ALBUMIN" in the last 168 hours. No results for input(s): "LIPASE", "AMYLASE" in the last 168 hours. No results for input(s): "AMMONIA" in the last 168 hours.  ABG    Component Value Date/Time   PHART 7.308 (L) 09/21/2023 1007   PCO2ART 50.1 (H) 09/21/2023 1007   PO2ART 223 (H) 09/21/2023 1007   HCO3 26.0 09/21/2023 1018   TCO2 25 09/21/2023 1136   ACIDBASEDEF 1.0 09/21/2023 1018   O2SAT 71 09/21/2023 1018     Coagulation Profile: Recent Labs  Lab 09/20/23 0429  INR 1.0    Cardiac Enzymes: No results for input(s): "CKTOTAL", "CKMB", "CKMBINDEX", "TROPONINI" in the last 168 hours.  HbA1C: Hgb A1c MFr Bld  Date/Time Value Ref Range Status  09/20/2023 04:29 AM 6.0 (H) 4.8 - 5.6 % Final    Comment:    (NOTE) Pre diabetes:          5.7%-6.4%  Diabetes:              >6.4%  Glycemic control for   <7.0% adults with diabetes     CBG: No results for input(s): "GLUCAP" in the last 168  hours.  Review of Systems:   See HPI   Past Medical History:  He,  has a past medical history of High cholesterol and Hypertension.   Surgical History:   Past Surgical History:  Procedure Laterality Date   cardiac catherization  COLONOSCOPY WITH PROPOFOL N/A 08/17/2018   Procedure: COLONOSCOPY WITH PROPOFOL;  Surgeon: Corbin Ade, MD;  Location: [facility];  Service: Endoscopy;  Laterality: N/A;  10:45am   LEFT HEART CATH AND CORONARY ANGIOGRAPHY N/A 09/19/2023   Procedure: LEFT HEART CATH AND CORONARY ANGIOGRAPHY;  Surgeon: Yates Decamp, MD;  Location: [facility];  Service: Cardiovascular;  Laterality: N/A;   POLYPECTOMY  08/17/2018   Procedure: POLYPECTOMY;  Surgeon: Corbin Ade, MD;  Location: [facility];  Service: Endoscopy;;  ileo-cecal valve (CSX2)/descending colon(CSx1)     Social History:   reports that he quit smoking about 2 months ago. His smoking use included cigarettes. He has a 40 pack-year smoking history. He uses smokeless tobacco. He reports that he does not drink alcohol and does not use drugs.   Family History:  His family history includes Diabetes in his father; Heart failure in his father; Hypertension in his father. There is no history of Colon cancer.   Allergies No Known Allergies   Home Medications  Prior to Admission medications   Medication Sig Start Date End Date Taking? Authorizing Provider  amLODipine (NORVASC) 10 MG tablet Take 10 mg by mouth daily. 07/03/23  Yes [provider]  aspirin EC 81 MG tablet Take 81 mg by mouth daily. Swallow whole.   Yes [provider]  Chlorphen-Phenyleph-ASA (ALKA-SELTZER SEVERE COLD PO) Take 1 Dose by mouth daily as needed (cold symptoms).   Yes [provider]  DM-Phenylephrine-Acetaminophen (VICKS DAYQUIL MULTI-SYMPTOM PO) Take 2 capsules by mouth every 4 (four) hours as needed (cold symptoms).   Yes [provider]  gabapentin (NEURONTIN) 100 MG capsule Take  100 mg by mouth 3 (three) times daily.   Yes [provider]  HYDROcodone-acetaminophen (NORCO) 10-325 MG tablet Take 1 tablet by mouth every 6 (six) hours as needed for moderate pain. 07/16/18  Yes [provider]  lisinopril-hydrochlorothiazide (ZESTORETIC) 10-12.5 MG tablet Take 1 tablet by mouth daily. 07/23/22  Yes [provider]  morphine (MS CONTIN) 60 MG 12 hr tablet Take 60 mg by mouth every 12 (twelve) hours. 09/09/23  Yes [provider]  nitroGLYCERIN (NITROSTAT) 0.4 MG SL tablet Place 1 tablet (0.4 mg total) under the tongue every 5 (five) minutes as needed for chest pain. If a single episode of chest pain is not relieved by one tablet, the patient will try another within 5 minutes; and if this doesn't relieve the pain, the patient is instructed to call 911 for transportation to an emergency department. 08/05/22 09/12/24 Yes Mallipeddi, Vishnu P, MD  oxycodone (ROXICODONE) 30 MG immediate release tablet Take 30 mg by mouth every 4 (four) hours as needed for pain. 09/12/23  Yes [provider]  pantoprazole (PROTONIX) 40 MG tablet Take 40 mg by mouth daily. 08/02/23  Yes [provider]  phenol (CHLORASEPTIC) 1.4 % LIQD Use as directed 3 sprays in the mouth or throat as needed for throat irritation / pain.   Yes [provider]  tiZANidine (ZANAFLEX) 4 MG tablet Take 4 mg by mouth every 6 (six) hours as needed for muscle spasms. 09/01/23  Yes [provider]  Vitamin D, Ergocalciferol, (DRISDOL) 1.25 MG (50000 UNIT) CAPS capsule Take 50,000 Units by mouth every Tuesday. 07/13/23  Yes [provider]  ranolazine (RANEXA) 500 MG 12 hr tablet Take 1 tablet (500 mg total) by mouth 2 (two) times daily. 09/13/23   Mallipeddi, Orion Modest, MD     Critical care  time: 45 mins    Christian Lenaya Pietsch AGACNP-BC   [facility] 09/21/2023, 5:55 PM  Please see [email or web address] for pager details.  From 7A-7P if no response,  please call [phone]. After hours, please call ELink [phone].

## 2023-09-21 NOTE — Progress Notes (Signed)
     [address]       [place] [address]             [phone]       No events Vitals:   09/20/23 2316 09/21/23 0452  BP: 123/81 126/84  Pulse: 81 76  Resp: 18 20  Temp: 98 F (36.7 C) 97.6 F (36.4 C)  SpO2: 94% 93%   Alert NAD Sinus  EWOB  OR today for AVR CABG

## 2023-09-21 NOTE — Transfer of Care (Signed)
 Immediate Anesthesia Transfer of Care Note  Patient: Alan Grant  Procedure(s) Performed: CORONARY ARTERY BYPASS GRAFTING (CABG) X FOUR, USING LEFT INTERNAL MAMMARY ARTERY AND RIGHT LEG GREATER SAPHENOUS VEIN HARVESTED ENDOSCOPICALLY (Chest) ECHOCARDIOGRAM, TRANSESOPHAGEAL, INTRAOPERATIVE AORTIC VALVE REPLACEMENT  USING INSPIRIS VALVE SIZE (Chest)  Patient Location: SICU  Anesthesia Type:General  Level of Consciousness: Patient remains intubated per anesthesia plan  Airway & Oxygen Therapy: Patient remains intubated per anesthesia plan  Post-op Assessment: Report given to RN and Post -op Vital signs reviewed and stable  Post vital signs: Reviewed and stable  Last Vitals:  Vitals Value Taken Time  BP 100/60   Temp 98   Pulse 71 09/21/23 1427  Resp 17 09/21/23 1427  SpO2 92 % 09/21/23 1427  Vitals shown include unfiled device data.  Last Pain:  Vitals:   09/21/23 0640  TempSrc:   PainSc: 0-No pain      Patients Stated Pain Goal: 1 (09/20/23 1500)  Complications: No notable events documented.

## 2023-09-21 NOTE — Hospital Course (Addendum)
 History of Present Illness:  Alan Grant is a 68 yo male with known history of HTN, chronic pain, and Nicotine abuse. He was referred for Cardiology evaluation in Feb 2024 due to complaints of exertional angina with radiation to his left arm. At that time the patient was exertional but would resolve with rest. 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 He was started on Lopressor to help with chest pain relief, but overall this was discontinued by his PCP who changed his medications. He presented for 1 year follow up on 09/13/23 at which time he continued to have angina which had now been occurring for a year. It was initially infrequent, but as of late occurring multiple times per week. It is now occurring with any exertional activities, making the patient avoid activities as he wanted to limit his chest pain. He denied any other symptoms of shortness of breath, palpitations, N/V, dizziness or near syncope. Due to this he felt patient should undergo cardiac catheterization and Echocardiogram. Patient was agreeable to proceed. He presented to [facility] on 09/19/2023 and underwent catheterization which revealed Left main disease.  He was admitted to the hospital for coronary bypass grafting consultation.  Hospital Course:  Mr. Gougeon chest pain free. He denies associated symptoms when he experienced episodes of chest pain prior to admission. He has long standing back problems for which he is prescribed narcotics. He is no longer smoking having quit in December, I commended patient on quitting. He has positive family history of CAD. He is willing to proceed with surgery, even though this was unexpected.  He was noted to have a loud murmur on exam.  Echocardiogram revealed moderate Aortic Stenosis.  Dr. Cliffton Asters evaluated patient and recommended coronary bypass grafting and Aortic Valve Replacement.  The risks and benefits of  the procedure were explained to the patient and he was agreeable to proceed.  He was taken to the operating room and underwent CABG x 4 utilizing LIMA to LAD, SVG to OM, SVG to PDA, SVG to DIAGIONAL, Aortic Valve Replacement with a 21 mm Inspiris Bioprosthetic valve, and endoscopic harvest of greater saphenous vein from his right leg.  He tolerated the procedure without difficulty and was taken to the SICU in stable condition.  The patient was extubated the evening of surgery.  He had issues with SVT overnight and was started on Amiodarone drip.  He was hypertensive and required NTG drip.  This was weaned as hemodynamics allowed.  He was resumed on home Norvasc for blood pressure control.  The patient's pacing wires, chest tubes, and arterial lines were removed without difficulty.  He was felt stable for transfer to the progressive care unit on 09/23/2023.  The patient remains in NSR.  He has responded well to diuretics.  His surgical incisions are healing without evidence of infection.  He is ambulating independently.  He is stable for discharge home today.

## 2023-09-21 NOTE — Anesthesia Postprocedure Evaluation (Signed)
 Anesthesia Post Note  Patient: Alan Grant  Procedure(s) Performed: CORONARY ARTERY BYPASS GRAFTING (CABG) X FOUR, USING LEFT INTERNAL MAMMARY ARTERY AND RIGHT LEG GREATER SAPHENOUS VEIN HARVESTED ENDOSCOPICALLY (Chest) ECHOCARDIOGRAM, TRANSESOPHAGEAL, INTRAOPERATIVE AORTIC VALVE REPLACEMENT  USING INSPIRIS VALVE SIZE (Chest)     Patient location during evaluation: ICU Anesthesia Type: General Level of consciousness: sedated and patient remains intubated per anesthesia plan Pain management: pain level controlled Vital Signs Assessment: post-procedure vital signs reviewed and stable Respiratory status: patient remains intubated per anesthesia plan Cardiovascular status: stable Postop Assessment: no apparent nausea or vomiting Anesthetic complications: no  No notable events documented.  Last Vitals:  Vitals:   09/21/23 0452 09/21/23 1425  BP: 126/84   Pulse: 76   Resp: 20   Temp: 36.4 C   SpO2: 93% 100%    Last Pain:  Vitals:   09/21/23 0640  TempSrc:   PainSc: 0-No pain                 Beryle Lathe

## 2023-09-21 NOTE — Op Note (Signed)
 [address]       [place] [address]             [phone]                                          09/21/2023 Patient:  Alan Grant Pre-Op Dx: Left Main CAD HTN Moderate aortic stenosis   Post-op Dx:  same Procedure: CABG X 4.  LIMA LAD, RSVG LPDA, OM, diagonal   Aortic valve replacement with a 21mm Insipiris Resilia  Endoscopic greater saphenous vein harvest on the right   Surgeon and Role:      * Dolorez Jeffrey, Eliezer Lofts, MD - Primary    * E. Barrett, PA-C - assisting An experienced assistant was required given the complexity of this surgery and the standard of surgical care. The assistant was needed for exposure, dissection, suctioning, retraction of delicate tissues and sutures, instrument exchange and for overall help during this procedure.    Anesthesia  general EBL:  Blood Administration: none Xclamp Time:  152 min Pump Time:   Drains: 23 F blake drain:  L, mediastinal  Wires: Ventricular Counts: correct   Indications: 68yo male admitted following an elective LHC which showed LM disease.  Echo also showed moderate AS.  He was taken to the OR for AVR/CABG Findings: Good vein, and LIMA.  Good LAD.  Small LPDA, good OM, and diagonal.  Calcified trileaflet valve.    Operative Technique: All invasive lines were placed in pre-op holding.  After the risks, benefits and alternatives were thoroughly discussed, the patient was brought to the operative theatre.  Anesthesia was induced, and the patient was prepped and draped in normal sterile fashion.  An appropriate surgical pause was performed, and pre-operative antibiotics were dosed accordingly.  We began with simultaneous incisions along the right leg for harvesting of the greater saphenous vein and the chest for the sternotomy.  In regards to the sternotomy, this was carried down with bovie cautery, and the sternum was divided with a reciprocating saw.  Meticulous hemostasis was  obtained.  The left internal thoracic artery was exposed and harvested in in pedicled fashion.  The patient was systemically heparinized, and the artery was divided distally, and placed in a papaverine sponge.    The sternal elevator was removed, and a retractor was placed.  The pericardium was divided in the midline and fashioned into a cradle with pericardial stitches.   After we confirmed an appropriate ACT, the ascending aorta was cannulated in standard fashion.  The right atrial appendage was used for venous cannulation site.  Cardiopulmonary bypass was initiated, and the heart retractor was placed. The cross clamp was applied, and a dose of anterograde cardioplegia was given with good arrest of the heart.  We moved to the posterior wall of the heart, and found a good target on the PDA.  An arteriotomy was made, and the vein graft was anastomosed to it in an end to side fashion.  Next we exposed the lateral wall, and found a good target on the OM.  An end to side anastomosis with the vein graft was then created.  Next, we exposed the anterior wall of the heart and identified a good target on diagonal.   An arteriotomy was created.  The vein was anastomosed in an end to side fashion.  Finally,  we exposed a good target on the LAD, and fashioned an end to side anastomosis between it and the LITA.    Another dose of anterograde cardioplegia was given with good arrest of the heart.  Our aortotomy was made and directed toward the non coronary cusp.  The valve was inspected.  All leaflets were excised.  The annulus was sized to a 21mm Insipiris.  The left ventricle was then copiously irrigated.  Pledgeted mattress sutures were placed circumferentially through the annulus.  These sutures were then passed through the sewing ring of the valve.  Once the valve was seated in the annulus, it was secured with Core-knot sutures.  We began to rewarm, and close our aortotomy in 2 layers.  We created an end to side  anastomosis between it and the proximal vein grafts.  Rings were placed on the proximal anastomosis.  A re-animation dose of cardioplegia was given.  After de-airing the heart, the aortic cross clamp was removed.  We checked our valve function, and for air using the TEE.    Hemostasis was obtained, and we separated from cardiopulmonary bypass without event.  The heparin was reversed with protamine.  Chest tubes and wires were placed, and the sternum was re-approximated with sternal wires.  The soft tissue and skin were re-approximated wth absorbable suture.    The patient tolerated the procedure without any immediate complications, and was transferred to the ICU in guarded condition.  Jennise Both Keane Scrape

## 2023-09-21 NOTE — Progress Notes (Signed)
  Echocardiogram Echocardiogram Transesophageal has been performed.  Alan Grant 09/21/2023, 10:15 AM

## 2023-09-21 NOTE — Brief Op Note (Signed)
 09/19/2023 - 09/21/2023  8:22 AM  PATIENT:  Alan Grant  68 y.o. male  PRE-OPERATIVE DIAGNOSIS:  Aortic Stenosis and Coronary Artery Disease  POST-OPERATIVE DIAGNOSIS:  Aortic Stenosis and Coronary Artery Disease  PROCEDURE:  Procedure(s):  CORONARY ARTERY BYPASS GRAFTING x 4 -LIMA to LAD -SVG to OM -SVG to PDA -SVG to DIAGONAL  AORTIC VALVE REPLACEMENT  USING INSPIRIS VALVE SIZE (N/A)  ECHOCARDIOGRAM, TRANSESOPHAGEAL, INTRAOPERATIVE (N/A)  ENDOSCOPIC HARVEST GREATER SAPHENOUS VEIN -Right Leg  Vein harvest time: 35 min Vein prep time: 15 min  SURGEON:  Surgeons and Role:    * Corliss Skains, MD - Primary  PHYSICIAN ASSISTANT: Lowella Dandy PA-C, Haywood Lasso PA-S  ASSISTANTS: Virgilio Frees RNFA   ANESTHESIA:   general  EBL:  767 mL   BLOOD ADMINISTERED:   CC CELLSAVER  DRAINS:  Left Pleural and Mediastinal Chest Drains    LOCAL MEDICATIONS USED:  NONE  SPECIMEN:  Source of Specimen:  Aortic Valve Leaflets  DISPOSITION OF SPECIMEN:  PATHOLOGY  COUNTS:  YES  TOURNIQUET:  * No tourniquets in log *  DICTATION: .Dragon Dictation  PLAN OF CARE: Admit to inpatient   PATIENT DISPOSITION:  ICU - intubated and hemodynamically stable.   Delay start of Pharmacological VTE agent (>24hrs) due to surgical blood loss or risk of bleeding: yes

## 2023-09-22 ENCOUNTER — Encounter (HOSPITAL_COMMUNITY): Payer: Self-pay | Admitting: Thoracic Surgery (Cardiothoracic Vascular Surgery)

## 2023-09-22 ENCOUNTER — Inpatient Hospital Stay (HOSPITAL_COMMUNITY)

## 2023-09-22 DIAGNOSIS — I200 Unstable angina: Secondary | ICD-10-CM | POA: Diagnosis not present

## 2023-09-22 DIAGNOSIS — Z951 Presence of aortocoronary bypass graft: Secondary | ICD-10-CM

## 2023-09-22 DIAGNOSIS — I10 Essential (primary) hypertension: Secondary | ICD-10-CM | POA: Diagnosis not present

## 2023-09-22 DIAGNOSIS — Z952 Presence of prosthetic heart valve: Secondary | ICD-10-CM

## 2023-09-22 LAB — CBC
HCT: 31.4 % — ABNORMAL LOW (ref 39.0–52.0)
Hemoglobin: 10.5 g/dL — ABNORMAL LOW (ref 13.0–17.0)
MCH: 28.8 pg (ref 26.0–34.0)
MCHC: 33.4 g/dL (ref 30.0–36.0)
MCV: 86.3 fL (ref 80.0–100.0)
Platelets: 241 10*3/uL (ref 150–400)
RBC: 3.64 MIL/uL — ABNORMAL LOW (ref 4.22–5.81)
RDW: 13.2 % (ref 11.5–15.5)
WBC: 12.4 10*3/uL — ABNORMAL HIGH (ref 4.0–10.5)
nRBC: 0 % (ref 0.0–0.2)

## 2023-09-22 LAB — BASIC METABOLIC PANEL WITH GFR
Anion gap: 5 (ref 5–15)
BUN: 9 mg/dL (ref 8–23)
CO2: 23 mmol/L (ref 22–32)
Calcium: 8.3 mg/dL — ABNORMAL LOW (ref 8.9–10.3)
Chloride: 108 mmol/L (ref 98–111)
Creatinine, Ser: 0.73 mg/dL (ref 0.61–1.24)
GFR, Estimated: >60 mL/min (ref >60)
Glucose, Bld: 130 mg/dL — ABNORMAL HIGH (ref 70–99)
Potassium: 4.1 mmol/L (ref 3.5–5.1)
Sodium: 136 mmol/L (ref 135–145)

## 2023-09-22 LAB — MAGNESIUM: Magnesium: 2.3 mg/dL (ref 1.7–2.4)

## 2023-09-22 LAB — GLUCOSE, CAPILLARY
Glucose-Capillary: 106 mg/dL — ABNORMAL HIGH (ref 70–99)
Glucose-Capillary: 118 mg/dL — ABNORMAL HIGH (ref 70–99)
Glucose-Capillary: 119 mg/dL — ABNORMAL HIGH (ref 70–99)
Glucose-Capillary: 120 mg/dL — ABNORMAL HIGH (ref 70–99)
Glucose-Capillary: 124 mg/dL — ABNORMAL HIGH (ref 70–99)
Glucose-Capillary: 135 mg/dL — ABNORMAL HIGH (ref 70–99)
Glucose-Capillary: 140 mg/dL — ABNORMAL HIGH (ref 70–99)

## 2023-09-22 LAB — PHOSPHORUS: Phosphorus: 3.3 mg/dL (ref 2.5–4.6)

## 2023-09-22 LAB — SURGICAL PATHOLOGY

## 2023-09-22 MED ORDER — AMIODARONE HCL IN DEXTROSE 360-4.14 MG/200ML-% IV SOLN
60.0000 mg/h | INTRAVENOUS | Status: AC
  Administered 2023-09-22 (×2): 60 mg/h via INTRAVENOUS
  Filled 2023-09-22 (×2): qty 200

## 2023-09-22 MED ORDER — METOPROLOL TARTRATE 25 MG PO TABS
25.0000 mg | ORAL_TABLET | Freq: Two times a day (BID) | ORAL | Status: DC
  Administered 2023-09-22 – 2023-09-25 (×7): 25 mg via ORAL
  Filled 2023-09-22 (×7): qty 1

## 2023-09-22 MED ORDER — AMLODIPINE BESYLATE 10 MG PO TABS
10.0000 mg | ORAL_TABLET | Freq: Every day | ORAL | Status: DC
  Administered 2023-09-22 – 2023-09-25 (×3): 10 mg via ORAL
  Filled 2023-09-22 (×5): qty 1

## 2023-09-22 MED ORDER — AMIODARONE HCL IN DEXTROSE 360-4.14 MG/200ML-% IV SOLN
30.0000 mg/h | INTRAVENOUS | Status: DC
  Administered 2023-09-22 – 2023-09-23 (×3): 30 mg/h via INTRAVENOUS
  Filled 2023-09-22 (×2): qty 200

## 2023-09-22 MED ORDER — ENOXAPARIN SODIUM 40 MG/0.4ML IJ SOSY
40.0000 mg | PREFILLED_SYRINGE | INTRAMUSCULAR | Status: DC
  Administered 2023-09-22 – 2023-09-25 (×4): 40 mg via SUBCUTANEOUS
  Filled 2023-09-22 (×5): qty 0.4

## 2023-09-22 MED ORDER — AMIODARONE LOAD VIA INFUSION
150.0000 mg | Freq: Once | INTRAVENOUS | Status: AC
  Administered 2023-09-22: 150 mg via INTRAVENOUS
  Filled 2023-09-22: qty 83.34

## 2023-09-22 MED ORDER — FUROSEMIDE 10 MG/ML IJ SOLN
40.0000 mg | Freq: Once | INTRAMUSCULAR | Status: AC
  Administered 2023-09-22: 40 mg via INTRAVENOUS
  Filled 2023-09-22: qty 4

## 2023-09-22 MED FILL — Heparin Sodium (Porcine) Inj 1000 Unit/ML: INTRAMUSCULAR | Qty: 20 | Status: AC

## 2023-09-22 MED FILL — Heparin Sodium (Porcine) Inj 1000 Unit/ML: INTRAMUSCULAR | Qty: 30 | Status: AC

## 2023-09-22 MED FILL — Sodium Bicarbonate IV Soln 8.4%: INTRAVENOUS | Qty: 50 | Status: AC

## 2023-09-22 MED FILL — Lidocaine HCl Local Preservative Free (PF) Inj 2%: INTRAMUSCULAR | Qty: 14 | Status: AC

## 2023-09-22 MED FILL — Calcium Chloride Inj 10%: INTRAVENOUS | Qty: 10 | Status: AC

## 2023-09-22 MED FILL — Mannitol IV Soln 20%: INTRAVENOUS | Qty: 500 | Status: AC

## 2023-09-22 MED FILL — Electrolyte-R (PH 7.4) Solution: INTRAVENOUS | Qty: 4000 | Status: AC

## 2023-09-22 NOTE — Progress Notes (Signed)
 NAME:  Alan Grant, MRN:  [id number], DOB:  1956/01/20, LOS: 3 ADMISSION DATE:  09/19/2023, CONSULTATION DATE:  3/26 REFERRING MD:  Cliffton Asters, CHIEF COMPLAINT:  CABG   History of Present Illness:  Pt is 68 yr old male with significant pmhx of HTN, HLD, chronic back pain, and nicotine abuse who presented for outpatient cardiac cath due to worsening angina. LHC revealed 95% left main, 95% proximal circumflex, and 90% proximal LAD and TCTS was consulted. Patient was then scheduled and underwent a CABG x 4 and AVR on 09/19/23.  Pertinent  Medical History   Past Medical History:  Diagnosis Date   High cholesterol    Hypertension      Significant Hospital Events: Including procedures, antibiotic start and stop dates in addition to other pertinent events   3/26 PCCM Consult, CABG x4 and AVR , extubated post-op  Interim History / Subjective:  Having pain today, no nausea or SOB.  Started on amiodarone overnight for narrow complex tachyarrhythmia. Tmax 100.4.  Objective   Blood pressure 126/84, pulse 78, temperature 100 F (37.8 C), resp. rate 18, height 6' (1.829 m), weight 100.5 kg, SpO2 92%. CVP:  [0 mmHg-17 mmHg] 3 mmHg CO:  [2.1 L/min-9.5 L/min] 7.7 L/min CI:  [1 L/min/m2-4.3 L/min/m2] 3.5 L/min/m2  Vent Mode: CPAP;PSV FiO2 (%):  [40 %-50 %] 40 % Set Rate:  [4 bmp-16 bmp] 5 bmp Vt Set:  [620 mL] 620 mL PEEP:  [5 cmH20] 5 cmH20 Pressure Support:  [10 cmH20] 10 cmH20   Intake/Output Summary (Last 24 hours) at 09/22/2023 0833 Last data filed at 09/22/2023 0800 Gross per 24 hour  Intake 5820.87 ml  Output 5572 ml  Net 248.87 ml   Filed Weights   09/19/23 0805 09/19/23 1552 09/22/23 0500  Weight: 99.8 kg 98.2 kg 100.5 kg    Examination: General: chronically ill appearing man lying in bed in NAD HEENT: [place]/AT, eyes anicteric CV: S1S2, RRR, mild pericardial rub over left chest  pulm: breathing comfortably on , reduced basilar breath sounds, no rhales Abs: soft,  NT Extremities: minimal edema Skin: warm, dry, no rashes Neuro: awake, alert, answering questions appropriately.  GU: foley with amber urine  CXR personally reviewed> RIJ CVC, increased interstitial markings, no effusions EKG: NSR, normal axis.    BUN 9 Cr 0.73 WBC 12.4 H/H 10.5/31.4 Platelets 241   Resolved Hospital Problem list   N/a   Assessment & Plan:  Coronary artery disease status post CABG x 4; LVEF 60-65% Moderate AS s/p AVR Post-op tachycardia, possibly Afib -amiodarone infusion -metoprolol -aspirin, statin -chest tubes per TCTS -complete post-op antibiotics -pain control per protocol; need to resume his PTA long-acting opiates -wean off NTG as able -pulmonary hygiene  Hyperglycemia; h/o preDM. A1c 6.0 -SSI PRN   ABLA- expected post-op Consumptive coagulopathy- expected post-op -transfuse for Hb <7 or hemodynamically significant bleeding; monitor   History of hypertension - resume PTA amlodipine, con't to hold PTA lisinopril and hydrochlorothiazide, increase Bblocker  History of chronic pain -PTA gabapentin -resume PTA long-acting opiates; monitor for respiratory suppression   H/o tobacco abuse; quit 05/2023 - Recommend lung cancer screening as an outpatient    Best Practice (right click and "Reselect all SmartList Selections" daily)   Diet/type: Regular consistency (see orders) DVT prophylaxis: LMWH Pressure ulcer(s). N/a Lines: Central line Foley:  removal ordered  Code Status:  full code Last date of multidisciplinary goals of care discussion   Labs   CBC: Recent Labs  Lab 09/19/23 1704 09/21/23  1610 09/21/23 0810 09/21/23 1211 09/21/23 1244 09/21/23 1432 09/21/23 1733 09/21/23 1738 09/21/23 1913 09/21/23 2239 09/22/23 0423  WBC 8.3 10.2  --   --   --  12.5*  --   --   --  11.2* 12.4*  HGB 13.3 14.5   < > 10.1*   < > 10.1* 6.8* 7.8* 9.2* 10.1* 10.5*  HCT 39.4 43.8   < > 29.8*   < > 30.2* 20.0* 23.0* 27.0* 30.4* 31.4*  MCV  84.9 86.1  --   --   --  87.8  --   --   --  86.6 86.3  PLT 304 339  --  208  --  186  --   --   --  210 241   < > = values in this interval not displayed.    Basic Metabolic Panel: Recent Labs  Lab 09/21/23 0512 09/21/23 0810 09/21/23 1136 09/21/23 1209 09/21/23 1244 09/21/23 1329 09/21/23 1332 09/21/23 1431 09/21/23 1733 09/21/23 1738 09/21/23 1913 09/21/23 2239 09/22/23 0423  NA 136   < > 135   < > 135   < > 139   < > 149* 146* 141 138 136  K 4.5   < > 5.1   < > 5.8*   < > 4.8   < > 2.6* 3.0* 3.8 4.0 4.1  CL 101   < > 105  --  106  --  108  --   --   --   --  107 108  CO2 26  --   --   --   --   --   --   --   --   --   --  23 23  GLUCOSE 93   < > 130*  --  128*  --  103*  --   --   --   --  127* 130*  BUN 15   < > 15  --  15  --  14  --   --   --   --  11 9  CREATININE 1.01   < > 0.70  --  0.70  --  0.70  --   --   --   --  0.73 0.73  CALCIUM 9.8  --   --   --   --   --   --   --   --   --   --  8.0* 8.3*  MG  --   --   --   --   --   --   --   --   --   --   --  2.4 2.3  PHOS  --   --   --   --   --   --   --   --   --   --   --   --  3.3   < > = values in this interval not displayed.   GFR: Estimated Creatinine Clearance: 111.5 mL/min (by C-G formula based on SCr of 0.73 mg/dL). Recent Labs  Lab 09/21/23 0512 09/21/23 1432 09/21/23 2239 09/22/23 0423  WBC 10.2 12.5* 11.2* 12.4*    Critical care time:       Steffanie Dunn, DO 09/22/23 9:14 AM [facility]  For contact information, see Amion. If no response to pager, please call PCCM consult pager. After hours, 7PM- 7AM, please call Elink.

## 2023-09-22 NOTE — Discharge Summary (Signed)
 [address]       [place] [address]             [phone]    Physician Discharge Summary  Patient ID: Alan Grant MRN: [id number] DOB/AGE: 01/16/1956 68 y.o.  Admit date: 09/19/2023 Discharge date: 09/25/2023  Admission Diagnoses:  Patient Active Problem List   Diagnosis Date Noted   Unstable angina pectoris (HCC) 09/19/2023   HTN, goal below 130/80 08/05/2022   Nicotine abuse 08/05/2022   Cardiac chest pain 08/05/2022   Bilateral leg pain 08/05/2022   Preventative health care 07/20/2018   Discharge Diagnoses:  Patient Active Problem List   Diagnosis Date Noted   S/P CABG x 4 09/22/2023   S/P AVR (aortic valve replacement) 09/22/2023   Unstable angina pectoris (HCC) 09/19/2023   HTN, goal below 130/80 08/05/2022   Nicotine abuse 08/05/2022   Cardiac chest pain 08/05/2022   Bilateral leg pain 08/05/2022   Preventative health care 07/20/2018   Discharged Condition: good  History of Present Illness:  Alan Grant is a 68 yo male with known history of HTN, chronic pain, and Nicotine abuse. He was referred for Cardiology evaluation in Feb 2024 due to complaints of exertional angina with radiation to his left arm. At that time the patient was exertional but would resolve with rest. 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 He was started on Lopressor to help with chest pain relief, but overall this was discontinued by his PCP who changed his medications. He presented for 1 year follow up on 09/13/23 at which time he continued to have angina which had now been occurring for a year. It was initially infrequent, but as of late occurring multiple times per week. It is now occurring with any exertional activities, making the patient avoid activities as he wanted to limit his chest pain. He denied any other symptoms of shortness of breath, palpitations, N/V, dizziness or near syncope.  Due to this he felt patient should undergo cardiac catheterization and Echocardiogram. Patient was agreeable to proceed. He presented to [facility] on 09/19/2023 and underwent catheterization which revealed Left main disease.  He was admitted to the hospital for coronary bypass grafting consultation.  Hospital Course:  Alan Grant chest pain free. He denies associated symptoms when he experienced episodes of chest pain prior to admission. He has long standing back problems for which he is prescribed narcotics. He is no longer smoking having quit in December, I commended patient on quitting. He has positive family history of CAD. He is willing to proceed with surgery, even though this was unexpected.  He was noted to have a loud murmur on exam.  Echocardiogram revealed moderate Aortic Stenosis.  Dr. Cliffton Asters evaluated patient and recommended coronary bypass grafting and Aortic Valve Replacement.  The risks and benefits of the procedure were explained to the patient and he was agreeable to proceed.  He was taken to the operating room and underwent CABG x 4 utilizing LIMA to LAD, SVG to OM, SVG to PDA, SVG to DIAGIONAL, Aortic Valve Replacement with a 21 mm Inspiris Bioprosthetic valve, and endoscopic harvest of greater saphenous vein from his right leg.  He tolerated the procedure without difficulty and was taken to the SICU in stable condition.  The patient was extubated the evening of surgery.  He had issues with SVT overnight and was started on Amiodarone drip.  He  was hypertensive and required NTG drip.  This was weaned as hemodynamics allowed.  He was resumed on home Norvasc for blood pressure control.  The patient's pacing wires, chest tubes, and arterial lines were removed without difficulty.  He was felt stable for transfer to the progressive care unit on 09/23/2023.  The patient remains in NSR.  He has responded well to diuretics.  His surgical incisions are healing without evidence of infection.  He  is ambulating independently.  He is stable for discharge home today.   Consults: pulmonary/intensive care  Significant Diagnostic Studies:   Angiography:   Left Heart Catheterization 09/19/23: Hemodynamic data: LVEDP 19 mmHg, no pressure gradient across the aortic valve.  Moderately elevated EDP.   Angiographic data: LM: Calcified, ostial LM has a 90 to 95% stenosis. LAD: Gives origin to a large D1 proximal which is calcified ostial 90% stenosis. LAD after the origin of D1 in the mid segment has a 90% calcific complex stenosis.  Mid to distal LAD is got mild disease. LCx: Dominant vessel.  Again proximal calcification is evident.  Proximal CX has a 90 to 95% calcific stenosis.  Large OM1, moderate-sized OM 3 and a large OM 4 and PDA branches distally.  Distal CX has 95% stenosis. RCA: Nondominant and small, mild disease. LIMA and RIMA are widely patent.  Echocardiogram: IMPRESSIONS    1. Left ventricular ejection fraction, by estimation, is 60 to 65%. The  left ventricle has normal function. The left ventricle has no regional  wall motion abnormalities. There is mild concentric left ventricular  hypertrophy. Left ventricular diastolic  parameters are consistent with Grade I diastolic dysfunction (impaired  relaxation).   2. Right ventricular systolic function is normal. The right ventricular  size is mildly enlarged. Tricuspid regurgitation signal is inadequate for  assessing PA pressure.   3. The mitral valve is normal in structure. Trivial mitral valve  regurgitation. No evidence of mitral stenosis.   4. Aortic stenosis is mild by velocity, moderate by valve area of 1.1cm^2  and dimensionless index of 0.37. The aortic valve was not well visualized.  There is severe calcifcation of the aortic valve. Aortic valve  regurgitation is not visualized. Moderate   aortic valve stenosis. Aortic valve area, by VTI measures 1.10 cm.  Aortic valve mean gradient measures 16.0 mmHg. Aortic  valve Vmax measures  2.73 m/s.   5. The inferior vena cava is dilated in size with >50% respiratory  variability, suggesting right atrial pressure of 8 mmHg.   Treatments: surgery:   09/21/2023 Patient:  Alan Grant Pre-Op Dx: Left Main CAD HTN Moderate aortic stenosis   Post-op Dx:  same Procedure: CABG X 4.  LIMA LAD, RSVG LPDA, OM, diagonal   Aortic valve replacement with a 21mm Insipiris Resilia  Endoscopic greater saphenous vein harvest on the right     Surgeon and Role:      * Lightfoot, Eliezer Lofts, MD - Primary    * E. Zalea Pete, PA-C - assisting An experienced assistant was required given the complexity of this surgery and the standard of surgical care. The assistant was needed for exposure, dissection, suctioning, retraction of delicate tissues and sutures, instrument exchange and for overall help during this procedure.    Discharge Exam: Blood pressure 126/64, pulse 84, temperature 98.4 F (36.9 C), temperature source Oral, resp. rate 14, height 6' (1.829 m), weight 100.5 kg, SpO2 90%.  General appearance: alert, cooperative, and no distress Heart: regular rate and rhythm Lungs: clear to  auscultation bilaterally Abdomen: soft, non-tender; bowel sounds normal; no masses,  no organomegaly Extremities: edema trace Wound: clean and dry  Discharge Medications:  The patient has been discharged on:   1.Beta Blocker:  Yes [ x  ]                              No   [   ]                              If No, reason:  2.Ace Inhibitor/ARB: Yes [ x  ]                                     No  [    ]                                     If No, reason:  3.Statin:   Yes [ x  ]                  No  [   ]                  If No, reason:  4.Ecasa:  Yes  [  x ]                  No   [   ]                  If No, reason:  Patient had ACS upon admission: No   Plavix/P2Y12 inhibitor: Yes [   ]                                      No  [ x  ]   Discharge Instructions     Amb  Referral to Cardiac Rehabilitation   Complete by: As directed    Diagnosis:  CABG Valve Replacement     Valve: Aortic   CABG X ___: 4   After initial evaluation and assessments completed: Virtual Based Care may be provided alone or in conjunction with Phase 2 Cardiac Rehab based on patient barriers.: Yes   Intensive Cardiac Rehabilitation (ICR) [facility] location only OR Traditional Cardiac Rehabilitation (TCR) *If criteria for ICR are not met will enroll in TCR [facility] only): Yes      Allergies as of 09/25/2023   No Known Allergies      Medication List     STOP taking these medications    ranolazine 500 MG 12 hr tablet Commonly known as: RANEXA       TAKE these medications    ALKA-SELTZER SEVERE COLD PO Take 1 Dose by mouth daily as needed (cold symptoms).   amiodarone 200 MG tablet Commonly known as: PACERONE Take 1 tablet (200 mg total) by mouth 2 (two) times daily. X 7 days, then decrease to 200 mg daily   amLODipine 10 MG tablet Commonly known as: NORVASC Take 10 mg by mouth daily.   aspirin EC 81 MG tablet Take 81 mg by mouth daily. Swallow whole.   atorvastatin 80 MG tablet Commonly known as: LIPITOR Take 1  tablet (80 mg total) by mouth daily. Start taking on: September 26, 2023   gabapentin 100 MG capsule Commonly known as: NEURONTIN Take 100 mg by mouth 3 (three) times daily.   HYDROcodone-acetaminophen 10-325 MG tablet Commonly known as: NORCO Take 1 tablet by mouth every 6 (six) hours as needed for moderate pain.   lisinopril-hydrochlorothiazide 10-12.5 MG tablet Commonly known as: ZESTORETIC Take 1 tablet by mouth daily.   metoprolol tartrate 25 MG tablet Commonly known as: LOPRESSOR Take 1 tablet (25 mg total) by mouth 2 (two) times daily.   morphine 60 MG 12 hr tablet Commonly known as: MS CONTIN Take 60 mg by mouth every 12 (twelve) hours.   nitroGLYCERIN 0.4 MG SL tablet Commonly known as: NITROSTAT Place 1 tablet (0.4 mg total) under the  tongue every 5 (five) minutes as needed for chest pain. If a single episode of chest pain is not relieved by one tablet, the patient will try another within 5 minutes; and if this doesn't relieve the pain, the patient is instructed to call 911 for transportation to an emergency department.   oxycodone 30 MG immediate release tablet Commonly known as: ROXICODONE Take 30 mg by mouth every 4 (four) hours as needed for pain.   pantoprazole 40 MG tablet Commonly known as: PROTONIX Take 40 mg by mouth daily.   phenol 1.4 % Liqd Commonly known as: CHLORASEPTIC Use as directed 3 sprays in the mouth or throat as needed for throat irritation / pain.   tiZANidine 4 MG tablet Commonly known as: ZANAFLEX Take 4 mg by mouth every 6 (six) hours as needed for muscle spasms.   VICKS DAYQUIL MULTI-SYMPTOM PO Take 2 capsules by mouth every 4 (four) hours as needed (cold symptoms).   Vitamin D (Ergocalciferol) 1.25 MG (50000 UNIT) Caps capsule Commonly known as: DRISDOL Take 50,000 Units by mouth every Tuesday.        Follow-up Information     Lightfoot, Eliezer Lofts, MD Follow up.   Specialty: Cardiothoracic Surgery Contact information: [address] [place] [address] [phone]         Marjo Bicker, MD Follow up on 10/17/2023.   Specialties: Cardiology, Internal Medicine Why: 11:40AM. Cardiology follow up. Office scheduler will move your echo back to 6 weeks to relook at the aortic valve replacement Contact information: [address] [place] [address] [phone]                 Signed:  Lowella Dandy, PA-C  09/25/2023, 8:31 AM

## 2023-09-22 NOTE — Progress Notes (Signed)
 [address]       [place] [address]             [phone]                 1 Day Post-Op Procedure(s) (LRB): CORONARY ARTERY BYPASS GRAFTING (CABG) X FOUR, USING LEFT INTERNAL MAMMARY ARTERY AND RIGHT LEG GREATER SAPHENOUS VEIN HARVESTED ENDOSCOPICALLY (N/A) ECHOCARDIOGRAM, TRANSESOPHAGEAL, INTRAOPERATIVE (N/A) AORTIC VALVE REPLACEMENT  USING INSPIRIS VALVE SIZE (N/A)   Events: SVT overnight On amio and rate controlled _______________________________________________________________ Vitals: BP 126/84 (BP Location: Right Arm)   Pulse 78   Temp 100 F (37.8 C)   Resp 18   Ht 6' (1.829 m)   Wt 100.5 kg   SpO2 92%   BMI 30.05 kg/m  Filed Weights   09/19/23 0805 09/19/23 1552 09/22/23 0500  Weight: 99.8 kg 98.2 kg 100.5 kg     - Neuro: alert NAD  - Cardiovascular: sinus  Drips: cardene, amio.   CVP:  [0 mmHg-17 mmHg] 3 mmHg CO:  [2.1 L/min-9.5 L/min] 7.7 L/min CI:  [1 L/min/m2-4.3 L/min/m2] 3.5 L/min/m2  - Pulm: EWOB  ABG    Component Value Date/Time   PHART 7.355 09/21/2023 1913   PCO2ART 33.1 09/21/2023 1913   PO2ART 77 (L) 09/21/2023 1913   HCO3 18.4 (L) 09/21/2023 1913   TCO2 19 (L) 09/21/2023 1913   ACIDBASEDEF 6.0 (H) 09/21/2023 1913   O2SAT 94 09/21/2023 1913    - Abd: ND - Extremity: warm  .Intake/Output      03/26 0701 03/27 0700 03/27 0701 03/28 0700   P.O.     I.V. (mL/kg) 3251.4 (32.4)    Blood 460    IV Piggyback 2533.6    Total Intake(mL/kg) 6245 (62.1)    Urine (mL/kg/hr) 4465 (1.9)    Blood 767    Chest Tube 200    Total Output 5432    Net +813            _______________________________________________________________ Labs:    Latest Ref Rng & Units 09/22/2023    4:23 AM 09/21/2023   10:39 PM 09/21/2023    7:13 PM  CBC  WBC 4.0 - 10.5 K/uL 12.4  11.2    Hemoglobin 13.0 - 17.0 g/dL 96.2  95.2  9.2   Hematocrit 39.0 - 52.0 % 31.4  30.4  27.0   Platelets 150 - 400 K/uL 241  210        Latest  Ref Rng & Units 09/22/2023    4:23 AM 09/21/2023   10:39 PM 09/21/2023    7:13 PM  CMP  Glucose 70 - 99 mg/dL 841  324    BUN 8 - 23 mg/dL 9  11    Creatinine 4.01 - 1.24 mg/dL 0.27  2.53    Sodium 664 - 145 mmol/L 136  138  141   Potassium 3.5 - 5.1 mmol/L 4.1  4.0  3.8   Chloride 98 - 111 mmol/L 108  107    CO2 22 - 32 mmol/L 23  23    Calcium 8.9 - 10.3 mg/dL 8.3  8.0      CXR: PV congestion  _______________________________________________________________  Assessment and Plan: POD 1 s/p AVR/CABG  Neuro: adjusting pain meds CV: wean cardene.  Continue amio.  On A/S/BB Pulm: IS, ambulation Renal: creat stable GI: on diet Heme: stable ID: afebrile Endo: SSi Dispo: continue ICU care   Brea Coleson O Lakashia Collison 09/22/2023 8:16 AM

## 2023-09-22 NOTE — Discharge Instructions (Signed)

## 2023-09-23 ENCOUNTER — Inpatient Hospital Stay (HOSPITAL_COMMUNITY)

## 2023-09-23 ENCOUNTER — Telehealth: Payer: Self-pay | Admitting: Internal Medicine

## 2023-09-23 LAB — CBC
HCT: 30.1 % — ABNORMAL LOW (ref 39.0–52.0)
Hemoglobin: 9.6 g/dL — ABNORMAL LOW (ref 13.0–17.0)
MCH: 28.5 pg (ref 26.0–34.0)
MCHC: 31.9 g/dL (ref 30.0–36.0)
MCV: 89.3 fL (ref 80.0–100.0)
Platelets: 233 10*3/uL (ref 150–400)
RBC: 3.37 MIL/uL — ABNORMAL LOW (ref 4.22–5.81)
RDW: 13.4 % (ref 11.5–15.5)
WBC: 15 10*3/uL — ABNORMAL HIGH (ref 4.0–10.5)
nRBC: 0 % (ref 0.0–0.2)

## 2023-09-23 LAB — GLUCOSE, CAPILLARY
Glucose-Capillary: 97 mg/dL (ref 70–99)
Glucose-Capillary: 115 mg/dL — ABNORMAL HIGH (ref 70–99)
Glucose-Capillary: 105 mg/dL — ABNORMAL HIGH (ref 70–99)
Glucose-Capillary: 111 mg/dL — ABNORMAL HIGH (ref 70–99)
Glucose-Capillary: 123 mg/dL — ABNORMAL HIGH (ref 70–99)

## 2023-09-23 LAB — BASIC METABOLIC PANEL WITH GFR
Anion gap: 5 (ref 5–15)
BUN: 11 mg/dL (ref 8–23)
CO2: 27 mmol/L (ref 22–32)
Calcium: 8.5 mg/dL — ABNORMAL LOW (ref 8.9–10.3)
Chloride: 101 mmol/L (ref 98–111)
Creatinine, Ser: 0.81 mg/dL (ref 0.61–1.24)
GFR, Estimated: >60 mL/min (ref >60)
Glucose, Bld: 109 mg/dL — ABNORMAL HIGH (ref 70–99)
Potassium: 4.3 mmol/L (ref 3.5–5.1)
Sodium: 133 mmol/L — ABNORMAL LOW (ref 135–145)

## 2023-09-23 MED ORDER — INSULIN ASPART 100 UNIT/ML IJ SOLN: 0.0000 [IU] | Freq: Three times a day (TID) | INTRAMUSCULAR | Status: DC

## 2023-09-23 MED ORDER — FUROSEMIDE 40 MG PO TABS
40.0000 mg | ORAL_TABLET | Freq: Every day | ORAL | Status: DC
  Administered 2023-09-23 – 2023-09-25 (×3): 40 mg via ORAL
  Filled 2023-09-23 (×3): qty 1

## 2023-09-23 MED ORDER — ~~LOC~~ CARDIAC SURGERY, PATIENT & FAMILY EDUCATION: Freq: Once | Status: AC

## 2023-09-23 MED ORDER — AMIODARONE HCL 200 MG PO TABS
200.0000 mg | ORAL_TABLET | Freq: Two times a day (BID) | ORAL | Status: DC
  Administered 2023-09-23 – 2023-09-25 (×5): 200 mg via ORAL
  Filled 2023-09-23 (×5): qty 1

## 2023-09-23 MED ORDER — SODIUM CHLORIDE 0.9% FLUSH: 3.0000 mL | INTRAVENOUS | Status: DC | PRN

## 2023-09-23 MED ORDER — SODIUM CHLORIDE 0.9% FLUSH
3.0000 mL | Freq: Two times a day (BID) | INTRAVENOUS | Status: DC
  Administered 2023-09-23 – 2023-09-25 (×5): 3 mL via INTRAVENOUS

## 2023-09-23 MED ORDER — OXYCODONE HCL 5 MG PO TABS
10.0000 mg | ORAL_TABLET | Freq: Once | ORAL | Status: AC
  Administered 2023-09-23: 10 mg via ORAL
  Filled 2023-09-23: qty 2

## 2023-09-23 MED ORDER — SODIUM CHLORIDE 0.9 % IV SOLN: 250.0000 mL | INTRAVENOUS | Status: AC | PRN

## 2023-09-23 MED ORDER — LIDOCAINE 5 % EX PTCH
1.0000 | MEDICATED_PATCH | CUTANEOUS | Status: DC
  Administered 2023-09-23 – 2023-09-24 (×2): 1 via TRANSDERMAL
  Filled 2023-09-23 (×2): qty 1

## 2023-09-23 NOTE — Progress Notes (Signed)
   NAME:  HAADI SANTELLAN, MRN:  [id number], DOB:  Feb 13, 1956, LOS: 4 ADMISSION DATE:  09/19/2023, CONSULTATION DATE:  3/26 REFERRING MD:  Cliffton Asters, CHIEF COMPLAINT:  CABG   History of Present Illness:  Pt is 68 yr old male with significant pmhx of HTN, HLD, chronic back pain, and nicotine abuse who presented for outpatient cardiac cath due to worsening angina. LHC revealed 95% left main, 95% proximal circumflex, and 90% proximal LAD and TCTS was consulted. Patient was then scheduled and underwent a CABG x 4 and AVR on 09/19/23.  Pertinent  Medical History   Past Medical History:  Diagnosis Date   High cholesterol    Hypertension      Significant Hospital Events: Including procedures, antibiotic start and stop dates in addition to other pertinent events   3/26 PCCM Consult, CABG x4 and AVR , extubated post-op  Interim History / Subjective:  Slept better last night. Still on 4LPM IS to 1400  Objective   Blood pressure (!) 114/58, pulse 79, temperature 98.2 F (36.8 C), temperature source Oral, resp. rate 19, height 6' (1.829 m), weight 90.7 kg, SpO2 94%. CVP:  [0 mmHg-5 mmHg] 1 mmHg CO:  [6.9 L/min-7.4 L/min] 7.1 L/min CI:  [3.1 L/min/m2-3.4 L/min/m2] 3.2 L/min/m2      Intake/Output Summary (Last 24 hours) at 09/23/2023 4034 Last data filed at 09/23/2023 7425 Gross per 24 hour  Intake 1261.58 ml  Output 1335 ml  Net -73.42 ml   Filed Weights   09/19/23 1552 09/22/23 0500 09/23/23 0500  Weight: 98.2 kg 100.5 kg 90.7 kg    Examination: No distress Minimal drain output Saphenous site dressed w/o strikethrough Lungs some crackles bases +murmur Moves to command Aox3 Abd soft  Current gtt: amio CXR looks fine WBC up slightly H/H stable Kidneys okay  Pacer at 40 backup, current sinus 70s  Resolved Hospital Problem list   N/a   Assessment & Plan:  Coronary artery disease status post CABG x 4; LVEF 60-65% Moderate AS s/p AVR Hyperglycemia; h/o preDM. A1c 6.0 History  of chronic pain H/o tobacco abuse; quit 05/2023  - Pacer, amio, GDMT and chest tube per TCTS - Encourage IS, mobility - Consider dose of lasix if lingering O2 needs: he may have undiagnosed COPD and would not be surprised if needs O2 on DC - Likely out of ICU later today - Consider LDCT lung cancer screening referral  Myrla Halsted MD PCCM

## 2023-09-23 NOTE — TOC Progression Note (Signed)
 Transition of Care [facility]) - Progression Note    Patient Details  Name: OTHMAR RINGER MRN: [id number] Date of Birth: Apr 26, 1956  Transition of Care [facility]) CM/SW Contact  Graves-Bigelow, Lamar Laundry, RN Phone Number: 09/23/2023, 3:10 PM  Clinical Narrative: Patient POD-2 CABG. Patient has support of spouse. Case Manager will continue to follow for transition of care needs as he progresses.       Expected Discharge Plan: Home w Home Health Services Barriers to Discharge: Continued Medical Work up  Expected Discharge Plan and Services   Discharge Planning Services: CM Consult   Living arrangements for the past 2 months: Mobile Home  Social Determinants of Health (SDOH) Interventions SDOH Screenings   Food Insecurity: No Food Insecurity (09/19/2023)  Housing: Low Risk  (09/19/2023)  Transportation Needs: No Transportation Needs (09/19/2023)  Utilities: Not At Risk (09/19/2023)  Social Connections: Socially Integrated (09/19/2023)  Tobacco Use: High Risk (09/21/2023)    Readmission Risk Interventions     No data to display

## 2023-09-23 NOTE — Telephone Encounter (Signed)
 Patient informed and verbalized understanding of plan. Please call patient and rechedule echo for 6 weeks from now.  Thanks

## 2023-09-23 NOTE — Telephone Encounter (Signed)
-----   Message from [facility] sent at 09/22/2023  4:16 PM EDT ----- Regarding: reschedule echo Please keep appt with Dr. Jenene Slicker, however move the echo back. Please reschedule the echo to 6 weeks from now and also note on the reason behind echo with recent aortic valve replacement.  Thank you Wynema Birch ----- Message ----- From: Zada Girt Sent: 09/22/2023  10:56 AM EDT To: Dewain Penning  2-3 week TOC appointment  6 week Echocardiogram   S/P CABG x4, AVR (tissue)  Thanks

## 2023-09-23 NOTE — Progress Notes (Signed)
 [address]       [place] [address]             [phone]                 2 Days Post-Op Procedure(s) (LRB): CORONARY ARTERY BYPASS GRAFTING (CABG) X FOUR, USING LEFT INTERNAL MAMMARY ARTERY AND RIGHT LEG GREATER SAPHENOUS VEIN HARVESTED ENDOSCOPICALLY (N/A) ECHOCARDIOGRAM, TRANSESOPHAGEAL, INTRAOPERATIVE (N/A) AORTIC VALVE REPLACEMENT  USING INSPIRIS VALVE SIZE (N/A)   Events: No events _____________________________________ Vitals: BP (!) 114/58   Pulse 79   Temp 98.2 F (36.8 C) (Oral)   Resp 19   Ht 6' (1.829 m)   Wt 90.7 kg   SpO2 94%   BMI 27.12 kg/m  Filed Weights   09/19/23 1552 09/22/23 0500 09/23/23 0500  Weight: 98.2 kg 100.5 kg 90.7 kg     - Neuro: alert NAD  - Cardiovascular: sinus  Drips:  amio.   CVP:  [0 mmHg-5 mmHg] 1 mmHg CO:  [7 L/min-7.4 L/min] 7.1 L/min CI:  [3.2 L/min/m2-3.4 L/min/m2] 3.2 L/min/m2  - Pulm: EWOB  ABG    Component Value Date/Time   PHART 7.355 09/21/2023 1913   PCO2ART 33.1 09/21/2023 1913   PO2ART 77 (L) 09/21/2023 1913   HCO3 18.4 (L) 09/21/2023 1913   TCO2 19 (L) 09/21/2023 1913   ACIDBASEDEF 6.0 (H) 09/21/2023 1913   O2SAT 94 09/21/2023 1913    - Abd: ND - Extremity: warm  .Intake/Output      03/27 0701 03/28 0700 03/28 0701 03/29 0700   P.O. 350    I.V. (mL/kg) 778.4 (8.6)    Blood     IV Piggyback 309.1    Total Intake(mL/kg) 1437.4 (15.8)    Urine (mL/kg/hr) 1375 (0.6)    Blood     Chest Tube 220 50   Total Output 1595 50   Net -157.6 -50        Urine Occurrence 1 x       _______________________________________________________________ Labs:    Latest Ref Rng & Units 09/23/2023    4:28 AM 09/22/2023    4:23 AM 09/21/2023   10:39 PM  CBC  WBC 4.0 - 10.5 K/uL 15.0  12.4  11.2   Hemoglobin 13.0 - 17.0 g/dL 9.6  69.6  29.5   Hematocrit 39.0 - 52.0 % 30.1  31.4  30.4   Platelets 150 - 400 K/uL 233  241  210       Latest Ref Rng & Units 09/23/2023    4:28 AM  09/22/2023    4:23 AM 09/21/2023   10:39 PM  CMP  Glucose 70 - 99 mg/dL 284  132  440   BUN 8 - 23 mg/dL 11  9  11    Creatinine 0.61 - 1.24 mg/dL 1.02  7.25  3.66   Sodium 135 - 145 mmol/L 133  136  138   Potassium 3.5 - 5.1 mmol/L 4.3  4.1  4.0   Chloride 98 - 111 mmol/L 101  108  107   CO2 22 - 32 mmol/L 27  23  23    Calcium 8.9 - 10.3 mg/dL 8.5  8.3  8.0     CXR: Small L effusion  _______________________________________________________________  Assessment and Plan: POD 2 s/p AVR/CABG  Neuro: adjusting pain meds CV: PO amio, will remove pacing wires  On A/S/BB Pulm: IS, ambulation Renal: creat stable GI: on diet Heme: stable ID: afebrile Endo: SSi Dispo: cfloor  Corliss Skains 09/23/2023 9:09 AM

## 2023-09-24 LAB — TYPE AND SCREEN
ABO/RH(D): O POS
Antibody Screen: NEGATIVE
Unit division: 0
Unit division: 0

## 2023-09-24 LAB — BPAM RBC
Blood Product Expiration Date: 202504262359
Blood Product Expiration Date: 202504262359
Unit Type and Rh: 5100
Unit Type and Rh: 5100

## 2023-09-24 LAB — CBC
HCT: 27.1 % — ABNORMAL LOW (ref 39.0–52.0)
Hemoglobin: 8.8 g/dL — ABNORMAL LOW (ref 13.0–17.0)
MCH: 28.2 pg (ref 26.0–34.0)
MCHC: 32.5 g/dL (ref 30.0–36.0)
MCV: 86.9 fL (ref 80.0–100.0)
Platelets: 215 10*3/uL (ref 150–400)
RBC: 3.12 MIL/uL — ABNORMAL LOW (ref 4.22–5.81)
RDW: 13.4 % (ref 11.5–15.5)
WBC: 10.6 10*3/uL — ABNORMAL HIGH (ref 4.0–10.5)
nRBC: 0 % (ref 0.0–0.2)

## 2023-09-24 LAB — BASIC METABOLIC PANEL WITH GFR
Anion gap: 8 (ref 5–15)
BUN: 18 mg/dL (ref 8–23)
CO2: 28 mmol/L (ref 22–32)
Calcium: 8.9 mg/dL (ref 8.9–10.3)
Chloride: 100 mmol/L (ref 98–111)
Creatinine, Ser: 0.95 mg/dL (ref 0.61–1.24)
GFR, Estimated: >60 mL/min (ref >60)
Glucose, Bld: 115 mg/dL — ABNORMAL HIGH (ref 70–99)
Potassium: 4.5 mmol/L (ref 3.5–5.1)
Sodium: 136 mmol/L (ref 135–145)

## 2023-09-24 LAB — GLUCOSE, CAPILLARY
Glucose-Capillary: 106 mg/dL — ABNORMAL HIGH (ref 70–99)
Glucose-Capillary: 110 mg/dL — ABNORMAL HIGH (ref 70–99)
Glucose-Capillary: 111 mg/dL — ABNORMAL HIGH (ref 70–99)
Glucose-Capillary: 118 mg/dL — ABNORMAL HIGH (ref 70–99)

## 2023-09-24 NOTE — Progress Notes (Signed)
      [address]       [place] [address]             [phone]                 3 Days Post-Op Procedure(s) (LRB): CORONARY ARTERY BYPASS GRAFTING (CABG) X FOUR, USING LEFT INTERNAL MAMMARY ARTERY AND RIGHT LEG GREATER SAPHENOUS VEIN HARVESTED ENDOSCOPICALLY (N/A) ECHOCARDIOGRAM, TRANSESOPHAGEAL, INTRAOPERATIVE (N/A) AORTIC VALVE REPLACEMENT  USING INSPIRIS VALVE SIZE (N/A)   Events: No events _____________________________________ Vitals: BP (!) 122/50   Pulse 87   Temp 98.2 F (36.8 C) (Oral)   Resp (!) 22   Ht 6' (1.829 m)   Wt 91.7 kg   SpO2 91%   BMI 27.42 kg/m  Filed Weights   09/22/23 0500 09/23/23 0500 09/24/23 0700  Weight: 100.5 kg 90.7 kg 91.7 kg     - Neuro: alert NAD  - Cardiovascular: sinus  Drips:  none     - Pulm: EWOB  ABG    Component Value Date/Time   PHART 7.355 09/21/2023 1913   PCO2ART 33.1 09/21/2023 1913   PO2ART 77 (L) 09/21/2023 1913   HCO3 18.4 (L) 09/21/2023 1913   TCO2 19 (L) 09/21/2023 1913   ACIDBASEDEF 6.0 (H) 09/21/2023 1913   O2SAT 94 09/21/2023 1913    - Abd: ND - Extremity: warm  .Intake/Output      03/28 0701 03/29 0700 03/29 0701 03/30 0700   P.O.     I.V. (mL/kg) 44 (0.5)    IV Piggyback 10.2    Total Intake(mL/kg) 54.2 (0.6)    Urine (mL/kg/hr) 0 (0)    Chest Tube 50    Total Output 50    Net +4.2         Urine Occurrence 5 x       _______________________________________________________________ Labs:    Latest Ref Rng & Units 09/24/2023    2:21 AM 09/23/2023    4:28 AM 09/22/2023    4:23 AM  CBC  WBC 4.0 - 10.5 K/uL 10.6  15.0  12.4   Hemoglobin 13.0 - 17.0 g/dL 8.8  9.6  96.2   Hematocrit 39.0 - 52.0 % 27.1  30.1  31.4   Platelets 150 - 400 K/uL 215  233  241       Latest Ref Rng & Units 09/24/2023    2:21 AM 09/23/2023    4:28 AM 09/22/2023    4:23 AM  CMP  Glucose 70 - 99 mg/dL 952  841  324   BUN 8 - 23 mg/dL 18  11  9    Creatinine 0.61 - 1.24 mg/dL 4.01  0.27   2.53   Sodium 135 - 145 mmol/L 136  133  136   Potassium 3.5 - 5.1 mmol/L 4.5  4.3  4.1   Chloride 98 - 111 mmol/L 100  101  108   CO2 22 - 32 mmol/L 28  27  23    Calcium 8.9 - 10.3 mg/dL 8.9  8.5  8.3     CXR: Small L effusion  _______________________________________________________________  Assessment and Plan: POD 3 s/p AVR/CABG  Neuro: adjusting pain meds CV: PO amio, will remove pacing wires  On A/S/BB Pulm: IS, ambulation Renal: creat stable GI: on diet Heme: stable ID: afebrile Endo: SSi Dispo: floor    Alan Grant O Chandria Rookstool 09/24/2023 9:43 AM

## 2023-09-24 NOTE — Plan of Care (Signed)
  Problem: Education: Goal: Understanding of CV disease, CV risk reduction, and recovery process will improve Outcome: Progressing   Problem: Activity: Goal: Ability to return to baseline activity level will improve Outcome: Progressing   Problem: Cardiovascular: Goal: Ability to achieve and maintain adequate cardiovascular perfusion will improve Outcome: Progressing   Problem: Health Behavior/Discharge Planning: Goal: Ability to safely manage health-related needs after discharge will improve Outcome: Progressing   Problem: Education: Goal: Knowledge of General Education information will improve Description: Including pain rating scale, medication(s)/side effects and non-pharmacologic comfort measures Outcome: Progressing   Problem: Health Behavior/Discharge Planning: Goal: Ability to manage health-related needs will improve Outcome: Progressing   Problem: Clinical Measurements: Goal: Ability to maintain clinical measurements within normal limits will improve Outcome: Progressing Goal: Will remain free from infection Outcome: Progressing Goal: Diagnostic test results will improve Outcome: Progressing Goal: Respiratory complications will improve Outcome: Progressing Goal: Cardiovascular complication will be avoided Outcome: Progressing   Problem: Activity: Goal: Risk for activity intolerance will decrease Outcome: Progressing   Problem: Nutrition: Goal: Adequate nutrition will be maintained Outcome: Progressing   Problem: Coping: Goal: Level of anxiety will decrease Outcome: Progressing

## 2023-09-24 NOTE — Progress Notes (Signed)
 CARDIAC REHAB PHASE I   PRE:  Rate/Rhythm: 78  BP:  Sitting: 114/58     SaO2: 92 on 2L oxygen   Patient says he has already walked twice this morning. Tolerated well with no symptoms. Awaiting tx. Encouraged next attempt walking to be with rolling [place] [employer]ader and not eva.  Education given to pt on heart healthy diet, sternal precautions, spirometer usage, wound care, and home exercise guidelines given. Will refer to cardiac rehab phase 2 at [facility] . Pt left in the bed with call bell in reach. Pt will have family to stay upon discharge for 1 week. Pt wife was previous nurse. Pt verbalize understanding and all questions were answered.   Harrie Jeans ACSM-CEP 09/24/2023 9:00 AM

## 2023-09-25 LAB — GLUCOSE, CAPILLARY: Glucose-Capillary: 113 mg/dL — ABNORMAL HIGH (ref 70–99)

## 2023-09-25 MED ORDER — METOPROLOL TARTRATE 25 MG PO TABS: 25.0000 mg | ORAL_TABLET | Freq: Two times a day (BID) | ORAL | 3 refills | Status: AC

## 2023-09-25 MED ORDER — ATORVASTATIN CALCIUM 80 MG PO TABS: 80.0000 mg | ORAL_TABLET | Freq: Every day | ORAL | 3 refills | Status: DC

## 2023-09-25 MED ORDER — AMIODARONE HCL 200 MG PO TABS: 200.0000 mg | ORAL_TABLET | Freq: Two times a day (BID) | ORAL | 1 refills | Status: DC

## 2023-09-25 MED FILL — Potassium Chloride Inj 2 mEq/ML: INTRAVENOUS | Qty: 40 | Status: AC

## 2023-09-25 MED FILL — Heparin Sodium (Porcine) Inj 1000 Unit/ML: Qty: 1000 | Status: AC

## 2023-09-25 MED FILL — Lidocaine HCl Local Preservative Free (PF) Inj 2%: INTRAMUSCULAR | Qty: 14 | Status: AC

## 2023-09-25 NOTE — Progress Notes (Signed)
      [address]       [place] [address]             [phone]      4 Days Post-Op Procedure(s) (LRB): CORONARY ARTERY BYPASS GRAFTING (CABG) X FOUR, USING LEFT INTERNAL MAMMARY ARTERY AND RIGHT LEG GREATER SAPHENOUS VEIN HARVESTED ENDOSCOPICALLY (N/A) ECHOCARDIOGRAM, TRANSESOPHAGEAL, INTRAOPERATIVE (N/A) AORTIC VALVE REPLACEMENT  USING INSPIRIS VALVE SIZE (N/A)  Subjective:  Patient feels great.  He is ambulating without difficulty.  He has already moved his bowels.  Objective: Vital signs in last 24 hours: Temp:  [97.8 F (36.6 C)-99.1 F (37.3 C)] 98.4 F (36.9 C) (03/30 0818) Pulse Rate:  [74-92] 84 (03/30 0818) Cardiac Rhythm: Normal sinus rhythm (03/30 0325) Resp:  [14-22] 14 (03/30 0818) BP: (107-139)/(54-73) 126/64 (03/30 0818) SpO2:  [90 %-95 %] 93 % (03/30 0309) Weight:  [100.5 kg] 100.5 kg (03/30 0407)  Intake/Output from previous day: 03/29 0701 - 03/30 0700 In: 150 [P.O.:150] Out: 600 [Urine:600]  General appearance: alert, cooperative, and no distress Heart: regular rate and rhythm Lungs: clear to auscultation bilaterally Abdomen: soft, non-tender; bowel sounds normal; no masses,  no organomegaly Extremities: edema trace Wound: clean and dry  Lab Results: Recent Labs    09/23/23 0428 09/24/23 0221  WBC 15.0* 10.6*  HGB 9.6* 8.8*  HCT 30.1* 27.1*  PLT 233 215   BMET:  Recent Labs    09/23/23 0428 09/24/23 0221  NA 133* 136  K 4.3 4.5  CL 101 100  CO2 27 28  GLUCOSE 109* 115*  BUN 11 18  CREATININE 0.81 0.95  CALCIUM 8.5* 8.9    PT/INR: No results for input(s): "LABPROT", "INR" in the last 72 hours. ABG    Component Value Date/Time   PHART 7.355 09/21/2023 1913   HCO3 18.4 (L) 09/21/2023 1913   TCO2 19 (L) 09/21/2023 1913   ACIDBASEDEF 6.0 (H) 09/21/2023 1913   O2SAT 94 09/21/2023 1913   CBG (last 3)  Recent Labs    09/24/23 1650 09/24/23 2123 09/25/23 0554  GLUCAP 111* 118* 113*     Assessment/Plan: S/P Procedure(s) (LRB): CORONARY ARTERY BYPASS GRAFTING (CABG) X FOUR, USING LEFT INTERNAL MAMMARY ARTERY AND RIGHT LEG GREATER SAPHENOUS VEIN HARVESTED ENDOSCOPICALLY (N/A) ECHOCARDIOGRAM, TRANSESOPHAGEAL, INTRAOPERATIVE (N/A) AORTIC VALVE REPLACEMENT  USING INSPIRIS VALVE SIZE (N/A)  CV- NSR, BP stable- continue Amiodarone, Norvasc, Lopressor Pulm- no acute issues, off oxygen, continue IS Renal- creatinine stable, weight at baseline.Marland KitchenMarland KitchenLasix, potassium CBGs- not a diabetic will d/c SSIP Dispo- patient stable, will d/c home today    LOS: 6 days    Lowella Dandy, PA-C 09/25/2023

## 2023-10-03 ENCOUNTER — Other Ambulatory Visit: Payer: Self-pay | Admitting: Thoracic Surgery (Cardiothoracic Vascular Surgery)

## 2023-10-03 DIAGNOSIS — Z951 Presence of aortocoronary bypass graft: Secondary | ICD-10-CM

## 2023-10-07 ENCOUNTER — Telehealth: Payer: Self-pay | Admitting: Thoracic Surgery (Cardiothoracic Vascular Surgery)

## 2023-10-11 ENCOUNTER — Other Ambulatory Visit

## 2023-10-13 ENCOUNTER — Telehealth: Payer: Self-pay | Admitting: Internal Medicine

## 2023-10-13 NOTE — Telephone Encounter (Signed)
 Spoke with patient - states he is feeling a lot better today.  States that he did over eat yesterday and made him feel a little gassy.  No c/o chest pain, sob or dizziness.  Oxygen sat running 94-95%.  BP 132/56  & HR running 48-55.  He is staying hydrated.  Does have follow up with Dr. Mallipeddi on 4/21 at 11:40 here in [place] and sees Dr. Deloise Ferries 10/17/2023 at 4:20 for TCTS follow up.  Suggest that he keep log of BP & HR readings to bring to f/u visit.  Checking BP 2 hours after morning medications with sitting 10 minutes prior.  He verbalized understanding.

## 2023-10-13 NOTE — Telephone Encounter (Signed)
 Patient is requesting a callback regarding him feeling very fatigue and weak since last night. Please advise

## 2023-10-17 ENCOUNTER — Encounter: Payer: Self-pay | Admitting: Internal Medicine

## 2023-10-17 ENCOUNTER — Ambulatory Visit: Attending: Internal Medicine | Admitting: Internal Medicine

## 2023-10-17 ENCOUNTER — Ambulatory Visit (INDEPENDENT_AMBULATORY_CARE_PROVIDER_SITE_OTHER): Payer: Self-pay | Admitting: Thoracic Surgery (Cardiothoracic Vascular Surgery)

## 2023-10-17 VITALS — BP 114/62 | HR 68 | Ht 72.0 in | Wt 221.4 lb

## 2023-10-17 DIAGNOSIS — I471 Supraventricular tachycardia, unspecified: Secondary | ICD-10-CM | POA: Diagnosis not present

## 2023-10-17 DIAGNOSIS — Z951 Presence of aortocoronary bypass graft: Secondary | ICD-10-CM

## 2023-10-17 DIAGNOSIS — Z79899 Other long term (current) drug therapy: Secondary | ICD-10-CM

## 2023-10-17 DIAGNOSIS — E785 Hyperlipidemia, unspecified: Secondary | ICD-10-CM

## 2023-10-17 NOTE — Progress Notes (Signed)
 Cardiology Office Note  Date: 10/17/2023   ID: Alan Grant, DOB Dec 18, 1955, MRN [id number]  PCP:  Kathyleen Parkins, MD  Cardiologist:  Lasalle Pointer, MD Electrophysiologist:  None    History of Present Illness: Alan Grant is a 68 y.o. male known to have HTN, nicotine abuse is here for follow-up visit.  Patient was initially referred to cardiology clinic for evaluation of exertional angina radiating to his left arm for almost more than 1 year, initially less frequent (occurring few times per week), gradually worsening and now occurring almost every day and sometimes multiple times throughout the day.  He gets chest pains with regular exertional activities like walking, carrying trash can etc.  He is avoiding severe exertional activities to limit chest pains.  He does not have any symptoms of DOE, dizziness, presyncope, syncope, palpitations or leg swelling.  Current smoker.  He underwent stress test in 2024 that showed no evidence of ischemia but inferior infarction.  Subsequent LHC showed ostial 95% left main stenosis and underwent 4v CABG (LIMA to LAD, SVG to OM, SVG to PDA, SVG to diagonal) and SAVR (21 mm Inspiris valve) for mild to moderate aortic valve stenosis.  Had postop SVT for which she was started on amiodarone  drip.  Adenosine was not given.  Quit smoking after discharge from the hospital.  He was started on atorvastatin  during the hospitalization.  He is here for follow-up visit.  He does not have any angina.  He does have chest soreness from the incision site.  Quit smoking after he was discharged from the hospital.  No other symptoms of SOB, dizziness, syncope, leg swelling.    Past Medical History:  Diagnosis Date   High cholesterol    Hypertension     Past Surgical History:  Procedure Laterality Date   AORTIC VALVE REPLACEMENT N/A 09/21/2023   Procedure: AORTIC VALVE REPLACEMENT  USING INSPIRIS VALVE SIZE ;  Surgeon: Hilarie Lovely, MD;  Location: [facility]  OR;  Service: Open Heart Surgery;  Laterality: N/A;   cardiac catherization     COLONOSCOPY WITH PROPOFOL  N/A 08/17/2018   Procedure: COLONOSCOPY WITH PROPOFOL ;  Surgeon: Suzette Espy, MD;  Location: [facility] END[facility] SUITE;  Service: Endoscopy;  Laterality: N/A;  10:45am   CORONARY ARTERY BYPASS GRAFT N/A 09/21/2023   Procedure: CORONARY ARTERY BYPASS GRAFTING (CABG) X FOUR, USING LEFT INTERNAL MAMMARY ARTERY AND RIGHT LEG GREATER SAPHENOUS VEIN HARVESTED ENDOSCOPICALLY;  Surgeon: Hilarie Lovely, MD;  Location: [facility] OR;  Service: Open Heart Surgery;  Laterality: N/A;   INTRAOPERATIVE TRANSESOPHAGEAL ECHOCARDIOGRAM N/A 09/21/2023   Procedure: ECHOCARDIOGRAM, TRANSESOPHAGEAL, INTRAOPERATIVE;  Surgeon: Hilarie Lovely, MD;  Location: [facility] OR;  Service: Open Heart Surgery;  Laterality: N/A;   LEFT HEART CATH AND CORONARY ANGIOGRAPHY N/A 09/19/2023   Procedure: LEFT HEART CATH AND CORONARY ANGIOGRAPHY;  Surgeon: Knox Perl, MD;  Location: [facility] INVASIVE CV LAB;  Service: Cardiovascular;  Laterality: N/A;   POLYPECTOMY  08/17/2018   Procedure: POLYPECTOMY;  Surgeon: Suzette Espy, MD;  Location: [facility];  Service: Endoscopy;;  ileo-cecal valve (CSX2)/descending colon(CSx1)    Current Outpatient Medications  Medication Sig Dispense Refill   amiodarone  (PACERONE ) 200 MG tablet Take 200 mg by mouth daily.     amLODipine  (NORVASC ) 10 MG tablet Take 10 mg by mouth daily.     aspirin  EC 81 MG tablet Take 81 mg by mouth daily. Swallow whole.     atorvastatin  (LIPITOR) 80 MG tablet Take 1 tablet (80  mg total) by mouth daily. 30 tablet 3   Chlorphen-Phenyleph-ASA (ALKA-SELTZER SEVERE COLD PO) Take 1 Dose by mouth daily as needed (cold symptoms).     DM-Phenylephrine -Acetaminophen  (VICKS DAYQUIL MULTI-SYMPTOM PO) Take 2 capsules by mouth every 4 (four) hours as needed (cold symptoms).     gabapentin  (NEURONTIN ) 100 MG capsule Take 100 mg by mouth 3 (three) times daily.     HYDROcodone -acetaminophen  (NORCO)  10-325 MG tablet Take 1 tablet by mouth every 6 (six) hours as needed for moderate pain.     lisinopril -hydrochlorothiazide  (ZESTORETIC ) 10-12.5 MG tablet Take 1 tablet by mouth daily.     metoprolol  tartrate (LOPRESSOR ) 25 MG tablet Take 1 tablet (25 mg total) by mouth 2 (two) times daily. 60 tablet 3   morphine  (MS CONTIN ) 60 MG 12 hr tablet Take 60 mg by mouth every 12 (twelve) hours.     nitroGLYCERIN  (NITROSTAT ) 0.4 MG SL tablet Place 1 tablet (0.4 mg total) under the tongue every 5 (five) minutes as needed for chest pain. If a single episode of chest pain is not relieved by one tablet, the patient will try another within 5 minutes; and if this doesn't relieve the pain, the patient is instructed to call 911 for transportation to an emergency department. 25 tablet 3   oxycodone  (ROXICODONE ) 30 MG immediate release tablet Take 30 mg by mouth every 4 (four) hours as needed for pain.     pantoprazole  (PROTONIX ) 40 MG tablet Take 40 mg by mouth daily.     phenol (CHLORASEPTIC) 1.4 % LIQD Use as directed 3 sprays in the mouth or throat as needed for throat irritation / pain.     TIADYLT ER 360 MG 24 hr capsule Take 360 mg by mouth daily.     tiZANidine  (ZANAFLEX ) 4 MG tablet Take 4 mg by mouth every 6 (six) hours as needed for muscle spasms.     Vitamin D, Ergocalciferol, (DRISDOL) 1.25 MG (50000 UNIT) CAPS capsule Take 50,000 Units by mouth every Tuesday.     No current facility-administered medications for this visit.   Allergies:  Patient has no known allergies.   Social History: The patient  reports that he quit smoking about 3 months ago. His smoking use included cigarettes. He has a 40 pack-year smoking history. He uses smokeless tobacco. He reports that he does not drink alcohol and does not use drugs.   Family History: The patient's family history includes Diabetes in his father; Heart failure in his father; Hypertension in his father.   ROS:  Please see the history of present illness.  Otherwise, complete review of systems is positive for none.  All other systems are reviewed and negative.   Physical Exam: VS:  BP 114/62   Pulse 68   Ht 6' (1.829 m)   Wt 221 lb 6.4 oz (100.4 kg)   SpO2 98%   BMI 30.03 kg/m , BMI Body mass index is 30.03 kg/m.  Wt Readings from Last 3 Encounters:  10/17/23 221 lb 6.4 oz (100.4 kg)  09/25/23 221 lb 9 oz (100.5 kg)  09/13/23 226 lb 3.2 oz (102.6 kg)    General: Patient appears comfortable at rest. HEENT: Conjunctiva and lids normal, oropharynx clear with moist mucosa. Neck: Supple, no elevated JVP or carotid bruits, no thyromegaly. Lungs: Clear to auscultation, nonlabored breathing at rest. Cardiac: Holosystolic murmur grade 3/6 Abdomen: Soft, nontender, no hepatomegaly, bowel sounds present, no guarding or rebound. Extremities: No pitting edema Skin: Warm and dry. Musculoskeletal: No kyphosis.  Neuropsychiatric: Alert and oriented x3, affect grossly appropriate.  ECG:  An ECG dated 08/05/2022 was personally reviewed today and demonstrated:  Normal sinus rhythm and no ST changes  Recent Labwork: 09/22/2023: Magnesium  2.3 09/24/2023: BUN 18; Creatinine, Ser 0.95; Hemoglobin 8.8; Platelets 215; Potassium 4.5; Sodium 136     Component Value Date/Time   CHOL 191 09/19/2023 1704   TRIG 158 (H) 09/19/2023 1704   HDL 27 (L) 09/19/2023 1704   CHOLHDL 7.1 09/19/2023 1704   VLDL 32 09/19/2023 1704   LDLCALC 132 (H) 09/19/2023 1704    Other Studies Reviewed Today: I personally reviewed the heart care referral records from the PCP  Assessment and Plan:   CAD manifested by accelerating angina s/p 4v CABG (LIMA to LAD, SVG to OM, SVG to PDA, SVG to diagonal), SAVR (21 mm Inspiris valve) with normal LVEF: No interval angina.  He has chest soreness from incision.  Continue aspirin  81 mg once daily, atorvastatin  80 mg nightly (started in March 2025).  Walking daily. ER precautions for chest pain provided.  Mild to moderate aortic valve  stenosis s/p  SAVR (21 mm Inspiris valve): I did not find Echocardiogram after SAVR.  He said he is scheduled on May 8 for echocardiogram in [employer].  Will follow-up on the echocardiogram.  Dental cleanings twice per year and SBE prophylaxis prior to dental procedures.  HLD, not at goal: LDL 132.  Continue atorvastatin  80 mg nightly. This was started after his CABG.  Will repeat lipid panel in 6 weeks.  Postop SVT: Patient had SVT in March 2025 after his CABG.  He was started on amiodarone  drip.  Did not receive any adenosine for SVT.  Today he is on amiodarone .  Will stop amiodarone .  Continue metoprolol  tartrate 25 mg twice daily.  He is not clear if he is taking diltiazem 360 mg once daily, he will call us  later and update us .  HTN, controlled: Continue amlodipine  10 mg once daily, lisinopril -HCTZ 10-12.5 mg once daily and metoprolol  tartrate 25 mg twice daily.  Nicotine abuse, former: Quit smoking completely after he underwent CABG.  His wife still smokes.  Told him not to be around her when she smokes.   Medication Adjustments/Labs and Tests Ordered: Current medicines are reviewed at length with the patient today.  Concerns regarding medicines are outlined above.   Tests Ordered: Orders Placed This Encounter  Procedures   EKG 12-Lead    Medication Changes: No orders of the defined types were placed in this encounter.   Disposition:  Follow up 3 months  Signed, Ashrita Chrismer Beauford Bounds, MD, 10/17/2023 11:33 AM    [facility] at [facility] [address], [place], [place] [address]

## 2023-10-17 NOTE — Progress Notes (Signed)
     [address]       [place] [address]             [phone]       Patient: Home Provider: Office Consent for Telemedicine visit obtained.  Today's visit was completed via a real-time telehealth (see specific modality noted below). The patient/authorized person provided oral consent at the time of the visit to engage in a telemedicine encounter with the present provider at [facility]. The patient/authorized person was informed of the potential benefits, limitations, and risks of telemedicine. The patient/authorized person expressed understanding that the laws that protect confidentiality also apply to telemedicine. The patient/authorized person acknowledged understanding that telemedicine does not provide emergency services and that he or she would need to call 911 or proceed to the nearest hospital for help if such a need arose.   Total time spent in the clinical discussion 10 minutes.  Telehealth Modality: Phone visit (audio only)  I had a telephone visit with  Alan Grant who is s/p AVR/CABG.  Overall doing well.  Pain is minimal.  Ambulating well. Vitals have been stable.  Alan Grant will see us  back in 1 month with a chest x-ray for cardiac rehab clearance.  Alan Grant [name]

## 2023-10-17 NOTE — Patient Instructions (Signed)
 Medication Instructions:  Your physician has recommended you make the following change in your medication: Stop taking Amiodarone  Let us  know if you are taking Tiadylt ER (Diltiazem) 360 mg daily by sending us  a MyChart message or calling Continue taking all other medications as prescribed  Labwork: Lipid Panel in 6 weeks at [facility]  Testing/Procedures: None  Follow-Up: Your physician recommends that you schedule a follow-up appointment in: 3 months  Any Other Special Instructions Will Be Listed Below (If Applicable).  Thank you for choosing [facility]!     If you need a refill on your cardiac medications before your next appointment, please call your pharmacy.

## 2023-11-03 ENCOUNTER — Ambulatory Visit: Attending: Internal Medicine

## 2023-11-03 DIAGNOSIS — R079 Chest pain, unspecified: Secondary | ICD-10-CM | POA: Diagnosis present

## 2023-11-03 MED ORDER — PERFLUTREN LIPID MICROSPHERE
1.0000 mL | INTRAVENOUS | Status: AC | PRN
  Administered 2023-11-03: 10 mL via INTRAVENOUS

## 2023-11-04 LAB — ECHOCARDIOGRAM COMPLETE
AR max vel: 1.52 cm2
AV Area VTI: 1.61 cm2
AV Area mean vel: 1.48 cm2
AV Mean grad: 11 mmHg
AV Peak grad: 22.2 mmHg
Ao pk vel: 2.36 m/s
Area-P 1/2: 3.77 cm2
MV VTI: 1.63 cm2
S' Lateral: 3.4 cm
Single Plane A4C EF: 72.1 %

## 2023-11-08 ENCOUNTER — Ambulatory Visit: Payer: Self-pay | Admitting: Cardiology

## 2023-11-23 ENCOUNTER — Inpatient Hospital Stay (HOSPITAL_COMMUNITY): Admission: RE | Admit: 2023-11-23 | Source: Ambulatory Visit

## 2023-11-23 ENCOUNTER — Other Ambulatory Visit: Payer: Self-pay | Admitting: Thoracic Surgery (Cardiothoracic Vascular Surgery)

## 2023-11-23 DIAGNOSIS — Z951 Presence of aortocoronary bypass graft: Secondary | ICD-10-CM

## 2023-11-23 NOTE — Progress Notes (Signed)
 [address]       [place] [address]             [phone]       HPI: Mr. Mulkern is a 68 year old male with a past medical history of hypertension, hyperlipidemia, chronic pain and nicotine abuse. The patient returns for routine postoperative follow-up having undergone CABG x 4 and Aortic Valve Replacement with a 21 mm Inspiris Bioprosthetic valve by Dr. Deloise Ferries on 09/21/23 The patient's early postoperative recovery while in the hospital was notable for SVT requiring PO Amiodarone  but otherwise had a routine postoperative hospital stay. He saw cardiology on 04/21 and they discontinued Amiodarone .  Since hospital discharge the patient reports he is almost back to 100%. He reports most of his chest soreness is gone but he is still sore on the left side of the incision. He also reports numbness and burning of his right leg around the [facility] site at times. He has been on pain medication for 20+ years for his back but does not feel he needs pain medication for his chest. He is able to walk better and further than he was able to prior to surgery. He reports dizziness but this was present prior to surgery, he denies LOC. He denies chest pain and tightness as well as shortness of breath.   Current Outpatient Medications  Medication Sig Dispense Refill   amLODipine  (NORVASC ) 10 MG tablet Take 10 mg by mouth daily.     aspirin  EC 81 MG tablet Take 81 mg by mouth daily. Swallow whole.     atorvastatin  (LIPITOR) 80 MG tablet Take 1 tablet (80 mg total) by mouth daily. 30 tablet 3   Chlorphen-Phenyleph-ASA (ALKA-SELTZER SEVERE COLD PO) Take 1 Dose by mouth daily as needed (cold symptoms).     DM-Phenylephrine -Acetaminophen  (VICKS DAYQUIL MULTI-SYMPTOM PO) Take 2 capsules by mouth every 4 (four) hours as needed (cold symptoms).     gabapentin  (NEURONTIN ) 100 MG capsule Take 100 mg by mouth 3 (three) times daily.     HYDROcodone -acetaminophen  (NORCO) 10-325 MG tablet Take 1 tablet by  mouth every 6 (six) hours as needed for moderate pain.     lisinopril -hydrochlorothiazide  (ZESTORETIC ) 10-12.5 MG tablet Take 1 tablet by mouth daily.     metoprolol  tartrate (LOPRESSOR ) 25 MG tablet Take 1 tablet (25 mg total) by mouth 2 (two) times daily. 60 tablet 3   morphine  (MS CONTIN ) 60 MG 12 hr tablet Take 60 mg by mouth every 12 (twelve) hours.     nitroGLYCERIN  (NITROSTAT ) 0.4 MG SL tablet Place 1 tablet (0.4 mg total) under the tongue every 5 (five) minutes as needed for chest pain. If a single episode of chest pain is not relieved by one tablet, the patient will try another within 5 minutes; and if this doesn't relieve the pain, the patient is instructed to call 911 for transportation to an emergency department. 25 tablet 3   oxycodone  (ROXICODONE ) 30 MG immediate release tablet Take 30 mg by mouth every 4 (four) hours as needed for pain.     pantoprazole  (PROTONIX ) 40 MG tablet Take 40 mg by mouth daily.     phenol (CHLORASEPTIC) 1.4 % LIQD Use as directed 3 sprays in the mouth or throat as needed for throat irritation / pain.     TIADYLT ER 360 MG 24 hr capsule Take 360 mg by mouth daily.     tiZANidine  (ZANAFLEX ) 4 MG tablet Take 4 mg by mouth  every 6 (six) hours as needed for muscle spasms.     Vitamin D, Ergocalciferol, (DRISDOL) 1.25 MG (50000 UNIT) CAPS capsule Take 50,000 Units by mouth every Tuesday.     No current facility-administered medications for this visit.   Vitals: Today's Vitals   11/29/23 0904  BP: (!) 150/74  Pulse: 61  Resp: 20  SpO2: 96%  Weight: 221 lb (100.2 kg)  Height: 6' (1.829 m)   Body mass index is 29.97 kg/m.  Physical Exam: General: Alert and oriented, no acute distress Neuro: Grossly intact CV: Regular rate and rhythm, soft systolic murmur Pulm: Clear to auscultation bilaterally GI: +BS, nontender, no distension Extremities: Trace edema BLE Wound: Sternal and EVH sites well healed without sign of infection  Diagnostic Tests: CLINICAL  DATA:  AVR, status post CABG   EXAM: CHEST - 2 VIEW   COMPARISON:  March 28 25   FINDINGS: The heart size and mediastinal contours are within normal limits. Both lungs are clear. The visualized skeletal structures are unremarkable. Post CABG. Status post aortic valve replacement.   IMPRESSION: No active cardiopulmonary disease.     Electronically Signed   By: Fredrich Jefferson M.D.   On: 11/29/2023 09:15  Impression/Plan: S/P CABG/AVR: Patient is progressing well for CABG, AVR. He is ambulating well without shortness of breath and CXR is without pleural effusion or infiltrate. Follow up echocardiogram showed LVEF 55-60% and mild aortic valve stenosis. Cardiology will continue to monitor and follow. He reports he is feeling close to 100%. He does report sternal soreness that is improving and should resolved with time. He has been driving and mowing the lawn without difficulty. He does report some numbness and tingling of his right leg around the [facility] site at times but this is improving. May have damaged the nerve while harvesting vein, should improve with time. He has a good appetite and we reviewed recommendations for a healthy diet. His incisions are well healed without sign of infection. Amiodarone  was discontinued at his last cardiology visit and he has not had any signs of further SVT. I cleared him for cardiac rehab but he feels he is progressing well on his own and does not feel the need to participate. We reviewed continued sternal precautions and endocarditis prophylaxis. Plan to have the patient return to the clinic as needed.   HTN: Blood pressure was elevated today in the clinic but well controlled at his last cardiology visit. He reports he takes his BP medications in the evening so he has not taken them yet today. I told him to monitor his BP at home and call cardiology if it remains elevated. Continue Norvasc  10mg  daily, Zestoretic  10-12.5mg  daily, Tiadylt ER 360mg  daily and Lopressor   25mg  BID. Will not make any medication changes at this time.   Tobacco Abuse: Quit smoking in December and has not smoked cigarettes since then. I congratulated him on this.  Randa Burton, PA-C [facility] [phone]

## 2023-11-23 NOTE — Patient Instructions (Addendum)
 You may return to driving an automobile as long as you are no longer requiring oral narcotic pain relievers during the daytime.  It would be wise to start driving only short distances during the daylight and gradually increase from there as you feel comfortable.  Continue to avoid any heavy lifting or strenuous use of your arms or shoulders for at least a total of three months from the time of surgery.  After three months you may gradually increase how much you lift or otherwise use your arms or chest as tolerated, with limits based upon whether or not activities lead to the return of significant discomfort.  You are encouraged to enroll and participate in the outpatient cardiac rehab program beginning as soon as practical. Endocarditis is a potentially serious infection of heart valves or inside lining of the heart.  It occurs more commonly in patients with diseased heart valves (such as patient's with aortic or mitral valve disease) and in patients who have undergone heart valve repair or replacement.  Certain surgical and dental procedures may put you at risk, such as dental cleaning, other dental procedures, or any surgery involving the respiratory, urinary, gastrointestinal tract, gallbladder or prostate gland.   To minimize your chances for develooping endocarditis, maintain good oral health and seek prompt medical attention for any infections involving the mouth, teeth, gums, skin or urinary tract.    Always notify your doctor or dentist about your underlying heart valve condition before having any invasive procedures. You will need to take antibiotics before certain procedures, including all routine dental cleanings or other dental procedures.  Your cardiologist or dentist should prescribe these antibiotics for you to be taken ahead of time.

## 2023-11-29 ENCOUNTER — Ambulatory Visit (HOSPITAL_COMMUNITY)
Admission: RE | Admit: 2023-11-29 | Discharge: 2023-11-29 | Disposition: A | Discharge status: Home / Self Care | Source: Ambulatory Visit | Attending: Cardiovascular Disease | Admitting: Cardiovascular Disease

## 2023-11-29 ENCOUNTER — Ambulatory Visit: Payer: Self-pay | Attending: Thoracic Surgery (Cardiothoracic Vascular Surgery) | Admitting: Physician Assistant

## 2023-11-29 ENCOUNTER — Encounter: Payer: Self-pay | Admitting: Physician Assistant

## 2023-11-29 ENCOUNTER — Ambulatory Visit

## 2023-11-29 VITALS — BP 150/74 | HR 61 | Resp 20 | Ht 72.0 in | Wt 221.0 lb

## 2023-11-29 DIAGNOSIS — Z951 Presence of aortocoronary bypass graft: Secondary | ICD-10-CM | POA: Insufficient documentation

## 2023-11-29 LAB — LIPID PANEL
Chol/HDL Ratio: 3.8 ratio (ref 0.0–5.0)
Cholesterol, Total: 133 mg/dL (ref 100–199)
HDL: 35 mg/dL — ABNORMAL LOW (ref >39)
LDL Chol Calc (NIH): 80 mg/dL (ref 0–99)
Triglycerides: 95 mg/dL (ref 0–149)
VLDL Cholesterol Cal: 18 mg/dL (ref 5–40)

## 2023-12-01 ENCOUNTER — Ambulatory Visit: Payer: Self-pay | Admitting: Internal Medicine

## 2023-12-29 ENCOUNTER — Other Ambulatory Visit: Payer: Self-pay | Admitting: Physician Assistant

## 2024-01-10 ENCOUNTER — Encounter: Payer: Self-pay | Admitting: Internal Medicine

## 2024-01-10 ENCOUNTER — Ambulatory Visit: Attending: Internal Medicine | Admitting: Internal Medicine

## 2024-01-10 VITALS — BP 124/72 | HR 63 | Ht 72.0 in | Wt 237.0 lb

## 2024-01-10 DIAGNOSIS — I2510 Atherosclerotic heart disease of native coronary artery without angina pectoris: Secondary | ICD-10-CM | POA: Insufficient documentation

## 2024-01-10 DIAGNOSIS — I25810 Atherosclerosis of coronary artery bypass graft(s) without angina pectoris: Secondary | ICD-10-CM | POA: Diagnosis present

## 2024-01-10 NOTE — Patient Instructions (Signed)
 Medication Instructions:  Continue all current medications.   Labwork: none  Testing/Procedures: none  Follow-Up: 6 months   Any Other Special Instructions Will Be Listed Below (If Applicable).   If you need a refill on your cardiac medications before your next appointment, please call your pharmacy.

## 2024-01-10 NOTE — Progress Notes (Signed)
 Cardiology Office Note  Date: 01/10/2024   ID: Alan Grant, DOB 01/30/1956, MRN [id number]  PCP:  Bertell Satterfield, MD  Cardiologist:  Diannah SHAUNNA Maywood, MD Electrophysiologist:  None    History of Present Illness: Alan Grant is a 68 y.o. male known to have HTN, nicotine abuse is here for follow-up visit.  Patient was initially referred to cardiology clinic for evaluation of exertional angina radiating to his left arm for almost more than 1 year, initially less frequent (occurring few times per week), gradually worsening and now occurring almost every day and sometimes multiple times throughout the day.  He gets chest pains with regular exertional activities like walking, carrying trash can etc.  He is avoiding severe exertional activities to limit chest pains.  He does not have any symptoms of DOE, dizziness, presyncope, syncope, palpitations or leg swelling.  Current smoker.  He underwent stress test in 2024 that showed no evidence of ischemia but inferior infarction.  Subsequent LHC showed ostial 95% left main stenosis and underwent 4v CABG (LIMA to LAD, SVG to OM, SVG to PDA, SVG to diagonal) and SAVR (21 mm Inspiris valve) for mild to moderate aortic valve stenosis.  Had postop SVT for which she was started on amiodarone  drip.  Adenosine was not given.  Quit smoking after discharge from the hospital.  He was started on atorvastatin  during the hospitalization.  He is here for follow-up visit.   No angina or DOE but noticing some shortness of breath with activities.  No other symptoms of palpitations, dizziness, syncope.  He does have some pain and discomfort from his right leg where the veins were harvested from.  Amiodarone  was discontinued in the last clinic visit.  Quit smoking in 2024 after which she started to gain weight.  Echocardiogram obtained in May 2025 was within normal limits.   Past Medical History:  Diagnosis Date   High cholesterol    Hypertension     Past Surgical  History:  Procedure Laterality Date   AORTIC VALVE REPLACEMENT N/A 09/21/2023   Procedure: AORTIC VALVE REPLACEMENT  USING INSPIRIS VALVE SIZE ;  Surgeon: Shyrl Linnie KIDD, MD;  Location: [facility] OR;  Service: Open Heart Surgery;  Laterality: N/A;   cardiac catherization     COLONOSCOPY WITH PROPOFOL  N/A 08/17/2018   Procedure: COLONOSCOPY WITH PROPOFOL ;  Surgeon: Shaaron Lamar HERO, MD;  Location: [facility] ENDO SUITE;  Service: Endoscopy;  Laterality: N/A;  10:45am   CORONARY ARTERY BYPASS GRAFT N/A 09/21/2023   Procedure: CORONARY ARTERY BYPASS GRAFTING (CABG) X FOUR, USING LEFT INTERNAL MAMMARY ARTERY AND RIGHT LEG GREATER SAPHENOUS VEIN HARVESTED ENDOSCOPICALLY;  Surgeon: Shyrl Linnie KIDD, MD;  Location: [facility] OR;  Service: Open Heart Surgery;  Laterality: N/A;   INTRAOPERATIVE TRANSESOPHAGEAL ECHOCARDIOGRAM N/A 09/21/2023   Procedure: ECHOCARDIOGRAM, TRANSESOPHAGEAL, INTRAOPERATIVE;  Surgeon: Shyrl Linnie KIDD, MD;  Location: [facility] OR;  Service: Open Heart Surgery;  Laterality: N/A;   LEFT HEART CATH AND CORONARY ANGIOGRAPHY N/A 09/19/2023   Procedure: LEFT HEART CATH AND CORONARY ANGIOGRAPHY;  Surgeon: Ladona Heinz, MD;  Location: [facility] INVASIVE CV LAB;  Service: Cardiovascular;  Laterality: N/A;   POLYPECTOMY  08/17/2018   Procedure: POLYPECTOMY;  Surgeon: Shaaron Lamar HERO, MD;  Location: [facility] ENDO SUITE;  Service: Endoscopy;;  ileo-cecal valve (CSX2)/descending colon(CSx1)    Current Outpatient Medications  Medication Sig Dispense Refill   amLODipine  (NORVASC ) 10 MG tablet Take 10 mg by mouth daily.     aspirin  EC 81 MG tablet Take 81 mg  by mouth daily. Swallow whole.     atorvastatin  (LIPITOR) 80 MG tablet Take 1 tablet (80 mg total) by mouth daily. 30 tablet 3   Chlorphen-Phenyleph-ASA (ALKA-SELTZER SEVERE COLD PO) Take 1 Dose by mouth daily as needed (cold symptoms).     DM-Phenylephrine -Acetaminophen  (VICKS DAYQUIL MULTI-SYMPTOM PO) Take 2 capsules by mouth every 4 (four) hours as needed (cold  symptoms).     gabapentin  (NEURONTIN ) 100 MG capsule Take 100 mg by mouth 3 (three) times daily.     HYDROcodone -acetaminophen  (NORCO) 10-325 MG tablet Take 1 tablet by mouth every 6 (six) hours as needed for moderate pain.     lisinopril -hydrochlorothiazide  (ZESTORETIC ) 10-12.5 MG tablet Take 1 tablet by mouth daily.     metoprolol  tartrate (LOPRESSOR ) 25 MG tablet Take 1 tablet (25 mg total) by mouth 2 (two) times daily. 60 tablet 3   morphine  (MS CONTIN ) 60 MG 12 hr tablet Take 60 mg by mouth every 12 (twelve) hours.     nitroGLYCERIN  (NITROSTAT ) 0.4 MG SL tablet Place 1 tablet (0.4 mg total) under the tongue every 5 (five) minutes as needed for chest pain. If a single episode of chest pain is not relieved by one tablet, the patient will try another within 5 minutes; and if this doesn't relieve the pain, the patient is instructed to call 911 for transportation to an emergency department. 25 tablet 3   oxycodone  (ROXICODONE ) 30 MG immediate release tablet Take 30 mg by mouth every 4 (four) hours as needed for pain.     pantoprazole  (PROTONIX ) 40 MG tablet Take 40 mg by mouth daily.     phenol (CHLORASEPTIC) 1.4 % LIQD Use as directed 3 sprays in the mouth or throat as needed for throat irritation / pain.     TIADYLT ER 360 MG 24 hr capsule Take 360 mg by mouth daily.     tiZANidine  (ZANAFLEX ) 4 MG tablet Take 4 mg by mouth every 6 (six) hours as needed for muscle spasms.     Vitamin D, Ergocalciferol, (DRISDOL) 1.25 MG (50000 UNIT) CAPS capsule Take 50,000 Units by mouth every Tuesday.     No current facility-administered medications for this visit.   Allergies:  Patient has no known allergies.   Social History: The patient  reports that he quit smoking about 6 months ago. His smoking use included cigarettes. He has a 40 pack-year smoking history. He uses smokeless tobacco. He reports that he does not drink alcohol and does not use drugs.   Family History: The patient's family history includes  Diabetes in his father; Heart failure in his father; Hypertension in his father.   ROS:  Please see the history of present illness. Otherwise, complete review of systems is positive for none.  All other systems are reviewed and negative.   Physical Exam: VS:  Ht 6' (1.829 m)   Wt 237 lb (107.5 kg)   BMI 32.14 kg/m , BMI Body mass index is 32.14 kg/m.  Wt Readings from Last 3 Encounters:  01/10/24 237 lb (107.5 kg)  11/29/23 221 lb (100.2 kg)  10/17/23 221 lb 6.4 oz (100.4 kg)    General: Patient appears comfortable at rest. HEENT: Conjunctiva and lids normal, oropharynx clear with moist mucosa. Neck: Supple, no elevated JVP or carotid bruits, no thyromegaly. Lungs: Clear to auscultation, nonlabored breathing at rest. Cardiac: Holosystolic murmur grade 3/6 Abdomen: Soft, nontender, no hepatomegaly, bowel sounds present, no guarding or rebound. Extremities: No pitting edema Skin: Warm and dry. Musculoskeletal: No kyphosis.  Neuropsychiatric: Alert and oriented x3, affect grossly appropriate.  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  Continue aspirin  81 mg once daily, atorvastatin  80 mg nightly (started in March 2025).  Mild to moderate aortic valve stenosis s/p  SAVR (21 mm Inspiris valve): Echocardiogram from May 2025 showed normal aortic prosthesis.  Dental  cleanings twice per year.  SBE prophylaxis prior to dental procedures.  HLD, not at goal: LDL 80.  Continue atorvastatin  80 mg nightly. This was started after his CABG. repeat panel around 1 month ago showed LDL 80.  Improved from 132.  Can recheck lipid panel in a few months.  Postop SVT: Patient had SVT in March 2025 after his CABG.  He was started on amiodarone  drip.  Did not receive any adenosine for SVT.  Discharged on amiodarone  drip was discontinued in the last cardiology clinic visit.  No recurrences.  Continue metoprolol  titrate 25 mg twice daily.  HTN, controlled: Continue amlodipine  10 mg once daily, lisinopril -HCTZ 10-12.5 mg once daily and metoprolol  tartrate 25 mg twice daily.  Nicotine abuse, former: Quit smoking after his CABG.   Medication Adjustments/Labs and Tests Ordered: Current medicines are reviewed at length with the patient today.  Concerns regarding medicines are outlined above.   Tests Ordered: No orders of the defined types were placed in this encounter.   Medication Changes: No orders of the defined types were placed in this encounter.   Disposition:  Follow up 6 months  Signed, Alan Laurie Arleta Maywood, MD, 01/10/2024 9:44 AM    [facility] [address], [place], [place] [address]

## 2024-03-26 ENCOUNTER — Ambulatory Visit: Payer: Self-pay

## 2024-03-26 NOTE — Chronic Care Management (AMB) (Signed)
 Records request.

## 2024-04-04 ENCOUNTER — Ambulatory Visit: Admitting: Family Medicine

## 2024-04-04 ENCOUNTER — Encounter: Payer: Self-pay | Admitting: Family Medicine

## 2024-04-04 VITALS — BP 102/67 | HR 75 | Temp 98.0°F | Ht 71.75 in | Wt 236.0 lb

## 2024-04-04 DIAGNOSIS — E559 Vitamin D deficiency, unspecified: Secondary | ICD-10-CM | POA: Diagnosis not present

## 2024-04-04 DIAGNOSIS — R7303 Prediabetes: Secondary | ICD-10-CM

## 2024-04-04 DIAGNOSIS — D649 Anemia, unspecified: Secondary | ICD-10-CM | POA: Diagnosis not present

## 2024-04-04 DIAGNOSIS — E7800 Pure hypercholesterolemia, unspecified: Secondary | ICD-10-CM

## 2024-04-04 DIAGNOSIS — Z0000 Encounter for general adult medical examination without abnormal findings: Secondary | ICD-10-CM

## 2024-04-04 DIAGNOSIS — Z125 Encounter for screening for malignant neoplasm of prostate: Secondary | ICD-10-CM

## 2024-04-04 DIAGNOSIS — I2510 Atherosclerotic heart disease of native coronary artery without angina pectoris: Secondary | ICD-10-CM

## 2024-04-04 DIAGNOSIS — I10 Essential (primary) hypertension: Secondary | ICD-10-CM | POA: Diagnosis not present

## 2024-04-04 LAB — LIPID PANEL
Cholesterol: 128 mg/dL (ref 0–200)
HDL: 43.3 mg/dL (ref >39.00)
LDL Cholesterol: 60 mg/dL (ref 0–99)
NonHDL: 84.26
Total CHOL/HDL Ratio: 3
Triglycerides: 123 mg/dL (ref 0.0–149.0)
VLDL: 24.6 mg/dL (ref 0.0–40.0)

## 2024-04-04 LAB — CBC
HCT: 40.5 % (ref 39.0–52.0)
Hemoglobin: 13 g/dL (ref 13.0–17.0)
MCHC: 32.1 g/dL (ref 30.0–36.0)
MCV: 84.3 fl (ref 78.0–100.0)
Platelets: 262 K/uL (ref 150.0–400.0)
RBC: 4.8 Mil/uL (ref 4.22–5.81)
RDW: 17.7 % — ABNORMAL HIGH (ref 11.5–15.5)
WBC: 8.8 K/uL (ref 4.0–10.5)

## 2024-04-04 LAB — COMPREHENSIVE METABOLIC PANEL WITH GFR
ALT: 30 U/L (ref 0–53)
AST: 26 U/L (ref 0–37)
Albumin: 4.6 g/dL (ref 3.5–5.2)
Alkaline Phosphatase: 119 U/L — ABNORMAL HIGH (ref 39–117)
BUN: 10 mg/dL (ref 6–23)
CO2: 28 meq/L (ref 19–32)
Calcium: 9.9 mg/dL (ref 8.4–10.5)
Chloride: 100 meq/L (ref 96–112)
Creatinine, Ser: 0.97 mg/dL (ref 0.40–1.50)
GFR: 81.3 mL/min (ref >60.00)
Glucose, Bld: 104 mg/dL — ABNORMAL HIGH (ref 70–99)
Potassium: 4.8 meq/L (ref 3.5–5.1)
Sodium: 136 meq/L (ref 135–145)
Total Bilirubin: 0.7 mg/dL (ref 0.2–1.2)
Total Protein: 7.4 g/dL (ref 6.0–8.3)

## 2024-04-04 LAB — HEMOGLOBIN A1C: Hgb A1c MFr Bld: 6.6 % — ABNORMAL HIGH (ref 4.6–6.5)

## 2024-04-04 LAB — PSA, MEDICARE: PSA: 0.05 ng/mL — ABNORMAL LOW (ref 0.10–4.00)

## 2024-04-04 LAB — VITAMIN D 25 HYDROXY (VIT D DEFICIENCY, FRACTURES): VITD: 41.53 ng/mL (ref 30.00–100.00)

## 2024-04-04 MED ORDER — AMOXICILLIN 875 MG PO TABS: 875.0000 mg | ORAL_TABLET | Freq: Two times a day (BID) | ORAL | 0 refills | Status: AC

## 2024-04-04 NOTE — Patient Instructions (Signed)
 Health Maintenance, Male  Adopting a healthy lifestyle and getting preventive care are important in promoting health and wellness. Ask your health care provider about:  The right schedule for you to have regular tests and exams.  Things you can do on your own to prevent diseases and keep yourself healthy.  What should I know about diet, weight, and exercise?  Eat a healthy diet    Eat a diet that includes plenty of vegetables, fruits, low-fat dairy products, and lean protein.  Do not eat a lot of foods that are high in solid fats, added sugars, or sodium.  Maintain a healthy weight  Body mass index (BMI) is a measurement that can be used to identify possible weight problems. It estimates body fat based on height and weight. Your health care provider can help determine your BMI and help you achieve or maintain a healthy weight.  Get regular exercise  Get regular exercise. This is one of the most important things you can do for your health. Most adults should:  Exercise for at least 150 minutes each week. The exercise should increase your heart rate and make you sweat (moderate-intensity exercise).  Do strengthening exercises at least twice a week. This is in addition to the moderate-intensity exercise.  Spend less time sitting. Even light physical activity can be beneficial.  Watch cholesterol and blood lipids  Have your blood tested for lipids and cholesterol at 66 years of age, then have this test every 5 years.  You may need to have your cholesterol levels checked more often if:  Your lipid or cholesterol levels are high.  You are older than 66 years of age.  You are at high risk for heart disease.  What should I know about cancer screening?  Many types of cancers can be detected early and may often be prevented. Depending on your health history and family history, you may need to have cancer screening at various ages. This may include screening for:  Colorectal cancer.  Prostate cancer.  Skin cancer.  Lung  cancer.  What should I know about heart disease, diabetes, and high blood pressure?  Blood pressure and heart disease  High blood pressure causes heart disease and increases the risk of stroke. This is more likely to develop in people who have high blood pressure readings or are overweight.  Talk with your health care provider about your target blood pressure readings.  Have your blood pressure checked:  Every 3-5 years if you are 24-52 years of age.  Every year if you are 3 years old or older.  If you are between the ages of 60 and 72 and are a current or former smoker, ask your health care provider if you should have a one-time screening for abdominal aortic aneurysm (AAA).  Diabetes  Have regular diabetes screenings. This checks your fasting blood sugar level. Have the screening done:  Once every three years after age 66 if you are at a normal weight and have a low risk for diabetes.  More often and at a younger age if you are overweight or have a high risk for diabetes.  What should I know about preventing infection?  Hepatitis B  If you have a higher risk for hepatitis B, you should be screened for this virus. Talk with your health care provider to find out if you are at risk for hepatitis B infection.  Hepatitis C  Blood testing is recommended for:  Everyone born from [birth year] through 1963.  Anyone  with known risk factors for hepatitis C.  Sexually transmitted infections (STIs)  You should be screened each year for STIs, including gonorrhea and chlamydia, if:  You are sexually active and are younger than 66 years of age.  You are older than 66 years of age and your health care provider tells you that you are at risk for this type of infection.  Your sexual activity has changed since you were last screened, and you are at increased risk for chlamydia or gonorrhea. Ask your health care provider if you are at risk.  Ask your health care provider about whether you are at high risk for HIV. Your health care provider  may recommend a prescription medicine to help prevent HIV infection. If you choose to take medicine to prevent HIV, you should first get tested for HIV. You should then be tested every 3 months for as long as you are taking the medicine.  Follow these instructions at home:  Alcohol use  Do not drink alcohol if your health care provider tells you not to drink.  If you drink alcohol:  Limit how much you have to 0-2 drinks a day.  Know how much alcohol is in your drink. In the [place] one drink equals one 12 oz bottle of beer (355 mL), one 5 oz glass of wine (148 mL), or one 1 oz glass of hard liquor (44 mL).  Lifestyle  Do not use any products that contain nicotine or tobacco. These products include cigarettes, chewing tobacco, and vaping devices, such as e-cigarettes. If you need help quitting, ask your health care provider.  Do not use street drugs.  Do not share needles.  Ask your health care provider for help if you need support or information about quitting drugs.  General instructions  Schedule regular health, dental, and eye exams.  Stay current with your vaccines.  Tell your health care provider if:  You often feel depressed.  You have ever been abused or do not feel safe at home.  Summary  Adopting a healthy lifestyle and getting preventive care are important in promoting health and wellness.  Follow your health care provider's instructions about healthy diet, exercising, and getting tested or screened for diseases.  Follow your health care provider's instructions on monitoring your cholesterol and blood pressure.  This information is not intended to replace advice given to you by your health care provider. Make sure you discuss any questions you have with your health care provider.  Document Revised: 11/03/2020 Document Reviewed: 11/03/2020  Elsevier Patient Education  2024 [employer].

## 2024-04-04 NOTE — Progress Notes (Signed)
 Office Note 04/04/2024  CC:  Chief Complaint  Patient presents with   Establish Care   Head Congestion    Sneezing; started taking Sudafed yesterday    HPI:  Alan Grant is a 68 y.o. male who is here to establish care, cpe, f/u HTN, hyperlipidemia, and CAD.  Patient's most recent primary MD: [facility] in [place]. Old records in epic/[facility]Link EMR were reviewed prior to or during today's visit.   He has had 2 weeks of nasal congestion, runny nose, sneezing, and gradually increasing pressure around the sinuses. No fever, sore throat, headache, or cough.  He has taken Sudafed for the last couple of days.  Of note, he has had chronic low back pain, no history of back surgery.  Has been on opioids for many years but weaned himself off of these completely and has not had any in the last 3 weeks.  He underwent CABG and aortic valve replacement in March this year.  Feeling well.  PMP AWARE reviewed today: most recent rx for MS contin  was filled 03/19/24, # 60, rx by Dr, Bertell. Most recent vicodin 10/325 rx filled 03/02/24, #120, Dr. Bertell.  Most recent oxycodone  30mg  rx filled 03/02/24, #180, Dr. Bertell. Most recent gabapentin  100 mg rx filled 03/02/24, #270, Dr. Bertell. No red flags.  Past Medical History:  Diagnosis Date   Aortic valvar stenosis    AVR 08/2023   Cervical radiculopathy    Chronic pain syndrome    Coronary artery disease    LM signif sten-->CABG 08/2023   GERD (gastroesophageal reflux disease)    High cholesterol    History of tobacco abuse    squit smoking 2025 after CABG   Hypertension    Lumbar radiculopathy    Obesity, Class I, BMI 30-34.9     Past Surgical History:  Procedure Laterality Date   AORTIC VALVE REPLACEMENT N/A 09/21/2023   Procedure: AORTIC VALVE REPLACEMENT  USING INSPIRIS VALVE SIZE ;  Surgeon: Shyrl Linnie KIDD, MD;  Location: [facility] OR;  Service: Open Heart Surgery;  Laterality: N/A;   COLONOSCOPY WITH PROPOFOL  N/A  08/17/2018   Procedure: COLONOSCOPY WITH PROPOFOL ;  Surgeon: Shaaron Lamar HERO, MD;  Location: [facility];  Service: Endoscopy;  Laterality: N/A;  10:45am   CORONARY ARTERY BYPASS GRAFT N/A 09/21/2023   Procedure: CORONARY ARTERY BYPASS GRAFTING (CABG) X FOUR, USING LEFT INTERNAL MAMMARY ARTERY AND RIGHT LEG GREATER SAPHENOUS VEIN HARVESTED ENDOSCOPICALLY;  Surgeon: Shyrl Linnie KIDD, MD;  Location: [facility] OR;  Service: Open Heart Surgery;  Laterality: N/A;   INTRAOPERATIVE TRANSESOPHAGEAL ECHOCARDIOGRAM N/A 09/21/2023   Procedure: ECHOCARDIOGRAM, TRANSESOPHAGEAL, INTRAOPERATIVE;  Surgeon: Shyrl Linnie KIDD, MD;  Location: [facility] OR;  Service: Open Heart Surgery;  Laterality: N/A;   LEFT HEART CATH AND CORONARY ANGIOGRAPHY N/A 09/19/2023   Procedure: LEFT HEART CATH AND CORONARY ANGIOGRAPHY;  Surgeon: Ladona Heinz, MD;  Location: [facility];  Service: Cardiovascular;  Laterality: N/A;   POLYPECTOMY  08/17/2018   Procedure: POLYPECTOMY;  Surgeon: Shaaron Lamar HERO, MD;  Location: [facility];  Service: Endoscopy;;  ileo-cecal valve (CSX2)/descending colon(CSx1)   TRANSTHORACIC ECHOCARDIOGRAM     08/2023:  Normal LVEF, indeterminate diastology, normal RV function, normal aortic prosthesis/no AS, and CVP 3 mmHg    Family History  Problem Relation Age of Onset   Hypertension Father    Heart failure Father    Diabetes Father    Colon cancer Neg Hx     Social History   Socioeconomic History  Marital status: Married    Spouse name: Not on file   Number of children: Not on file   Years of education: Not on file   Highest education level: 12th grade  Occupational History   Not on file  Tobacco Use   Smoking status: Former    Current packs/day: 0.00    Average packs/day: 1 pack/day for 40.0 years (40.0 ttl pk-yrs)    Types: Cigarettes    Quit date: 06/27/2023    Years since quitting: 0.7   Smokeless tobacco: Current   Tobacco comments:    Nicotine pouches   Vaping Use   Vaping  status: Never Used  Substance and Sexual Activity   Alcohol use: No   Drug use: No   Sexual activity: Yes    Birth control/protection: None  Other Topics Concern   Not on file  Social History Narrative   Married, 4 stepchildren.  1 biologic child, deceased.   Lives in [place] .   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   He enjoys riding motorcycles.   No alcohol.   Former smoker--> 50 pack-year history, quit December 2024.   Social Drivers of Corporate investment banker Strain: Low Risk  (04/03/2024)   Overall Financial Resource Strain (CARDIA)    Difficulty of Paying Living Expenses: Not very hard  Food Insecurity: No Food Insecurity (04/03/2024)   Hunger Vital Sign    Worried About Running Out of Food in the Last Year: Never true    Ran Out of Food in the Last Year: Never true  Transportation Needs: No Transportation Needs (04/03/2024)   PRAPARE - Administrator, Civil Service (Medical): No    Lack of Transportation (Non-Medical): No  Physical Activity: Inactive (04/03/2024)   Exercise Vital Sign    Days of Exercise per Week: 0 days    Minutes of Exercise per Session: Not on file  Stress: No Stress Concern Present (04/03/2024)   [employer] of Occupational Health - Occupational Stress Questionnaire    Feeling of Stress: Only a little  Social Connections: Moderately Integrated (04/03/2024)   Social Connection and Isolation Panel    Frequency of Communication with Friends and Family: Once a week    Frequency of Social Gatherings with Friends and Family: Once a week    Attends Religious Services: More than 4 times per year    Active Member of [employer] or Organizations: Yes    Attends Engineer, structural: More than 4 times per year    Marital Status: Married  Catering manager Violence: Not At Risk (09/19/2023)   Humiliation, Afraid, Rape, and Kick  questionnaire    Fear of Current or Ex-Partner: No    Emotionally Abused: No    Physically Abused: No    Sexually Abused: No    Outpatient Encounter Medications as of 04/04/2024  Medication Sig   amiodarone  (PACERONE ) 200 MG tablet Take 200 mg by mouth daily.   amLODipine  (NORVASC ) 10 MG tablet Take 10 mg by mouth daily.   amoxicillin (AMOXIL) 875 MG tablet Take 1 tablet (875 mg total) by mouth 2 (two) times daily for 10 days.   aspirin  EC 81 MG tablet Take 81 mg by mouth daily. Swallow whole.   atorvastatin  (LIPITOR) 80 MG tablet Take 1 tablet (80 mg total) by mouth daily.   lisinopril -hydrochlorothiazide  (ZESTORETIC ) 10-12.5 MG tablet Take 1 tablet by mouth daily.  metoprolol  tartrate (LOPRESSOR ) 25 MG tablet Take 1 tablet (25 mg total) by mouth 2 (two) times daily.   nitroGLYCERIN  (NITROSTAT ) 0.4 MG SL tablet Place 1 tablet (0.4 mg total) under the tongue every 5 (five) minutes as needed for chest pain. If a single episode of chest pain is not relieved by one tablet, the patient will try another within 5 minutes; and if this doesn't relieve the pain, the patient is instructed to call 911 for transportation to an emergency department.   pantoprazole  (PROTONIX ) 40 MG tablet Take 40 mg by mouth daily.   TIADYLT ER 360 MG 24 hr capsule Take 360 mg by mouth daily.   Vitamin D, Ergocalciferol, (DRISDOL) 1.25 MG (50000 UNIT) CAPS capsule Take 50,000 Units by mouth every Tuesday.   [DISCONTINUED] Chlorphen-Phenyleph-ASA (ALKA-SELTZER SEVERE COLD PO) Take 1 Dose by mouth daily as needed (cold symptoms).   [DISCONTINUED] DM-Phenylephrine -Acetaminophen  (VICKS DAYQUIL MULTI-SYMPTOM PO) Take 2 capsules by mouth every 4 (four) hours as needed (cold symptoms).   [DISCONTINUED] gabapentin  (NEURONTIN ) 100 MG capsule Take 100 mg by mouth 3 (three) times daily.   [DISCONTINUED] HYDROcodone -acetaminophen  (NORCO) 10-325 MG tablet Take 1 tablet by mouth every 6 (six) hours as needed for moderate pain.    [DISCONTINUED] morphine  (MS CONTIN ) 60 MG 12 hr tablet Take 60 mg by mouth every 12 (twelve) hours.   [DISCONTINUED] oxycodone  (ROXICODONE ) 30 MG immediate release tablet Take 30 mg by mouth every 4 (four) hours as needed for pain.   [DISCONTINUED] phenol (CHLORASEPTIC) 1.4 % LIQD Use as directed 3 sprays in the mouth or throat as needed for throat irritation / pain.   [DISCONTINUED] tiZANidine  (ZANAFLEX ) 4 MG tablet Take 4 mg by mouth every 6 (six) hours as needed for muscle spasms.   No facility-administered encounter medications on file as of 04/04/2024.    No Known Allergies  Review of Systems  Constitutional:  Negative for appetite change, chills, fatigue and fever.  HENT:  Positive for postnasal drip, rhinorrhea, sinus pressure and sneezing. Negative for congestion, dental problem, ear pain and sore throat.   Eyes:  Negative for discharge, redness and visual disturbance.  Respiratory:  Negative for cough, chest tightness, shortness of breath and wheezing.   Cardiovascular:  Negative for chest pain, palpitations and leg swelling.  Gastrointestinal:  Negative for abdominal pain, blood in stool, diarrhea, nausea and vomiting.  Genitourinary:  Negative for difficulty urinating, dysuria, flank pain, frequency, hematuria and urgency.  Musculoskeletal:  Negative for arthralgias, back pain, joint swelling, myalgias and neck stiffness.  Skin:  Negative for pallor and rash.  Neurological:  Negative for dizziness, speech difficulty, weakness and headaches.  Hematological:  Negative for adenopathy. Does not bruise/bleed easily.  Psychiatric/Behavioral:  Negative for confusion and sleep disturbance. The patient is not nervous/anxious.     PE; Blood pressure 102/67, pulse 75, temperature 98 F (36.7 C), temperature source Oral, height 5' 11.75 (1.822 m), weight 236 lb (107 kg), SpO2 97%. Body mass index is 32.23 kg/m.  Physical Exam  Gen: Alert, well appearing.  Patient is oriented to  person, place, time, and situation. AFFECT: pleasant, lucid thought and speech. ENT: Ears: EACs clear, normal epithelium.  TMs with good light reflex and landmarks bilaterally.  Eyes: no injection, icteris, swelling, or exudate.  EOMI, PERRLA. Nose: no drainage or turbinate edema/swelling.  No injection or focal lesion.  Mouth: lips without lesion/swelling.  Oral mucosa pink and moist.  Dentition intact and without obvious caries or gingival swelling.  Oropharynx without erythema,  exudate, or swelling.  Neck: supple/nontender.  No LAD, mass, or TM.  Carotid pulses 2+ bilaterally, without bruits. CV: RRR, no m/r/g.   LUNGS: CTA bilat, nonlabored resps, good aeration in all lung fields. ABD: soft, NT, ND, BS normal.  No hepatospenomegaly or mass.  No bruits. EXT: no clubbing, cyanosis, or edema.  Musculoskeletal: no joint swelling, erythema, warmth, or tenderness.  ROM of all joints intact. Skin - no sores or suspicious lesions or rashes or color changes  Pertinent labs:  Last CBC Lab Results  Component Value Date   WBC 10.6 (H) 09/24/2023   HGB 8.8 (L) 09/24/2023   HCT 27.1 (L) 09/24/2023   MCV 86.9 09/24/2023   MCH 28.2 09/24/2023   RDW 13.4 09/24/2023   PLT 215 09/24/2023   Last metabolic panel Lab Results  Component Value Date   GLUCOSE 115 (H) 09/24/2023   NA 136 09/24/2023   K 4.5 09/24/2023   CL 100 09/24/2023   CO2 28 09/24/2023   BUN 18 09/24/2023   CREATININE 0.95 09/24/2023   GFRNONAA >60 09/24/2023   CALCIUM  8.9 09/24/2023   PHOS 3.3 09/22/2023   ANIONGAP 8 09/24/2023   Last lipids Lab Results  Component Value Date   CHOL 133 11/28/2023   HDL 35 (L) 11/28/2023   LDLCALC 80 11/28/2023   TRIG 95 11/28/2023   CHOLHDL 3.8 11/28/2023   Last hemoglobin A1c Lab Results  Component Value Date   HGBA1C 6.0 (H) 09/20/2023   ASSESSMENT AND PLAN:   New patient, establishing care.  #1 health maintenance exam: Reviewed age and gender appropriate health maintenance  issues (prudent diet, regular exercise, health risks of tobacco and excessive alcohol, use of seatbelts, fire alarms in home, use of sunscreen).  Also reviewed age and gender appropriate health screening as well as vaccine recommendations. Vaccines: Tdap and shingrix rx's to pharmacy.  He declined prevnar 20. Labs: Lipid panel, CMET, CBC hemoglobin A1c (prediabetes), PSA, vitamin D (vitamin D deficiency) Prostate ca screening: PSA today Colon ca screening: Polyp February 2020, recall 5 yrs-->reminded today.  #2 acute sinusitis. Amoxicillin 875 twice daily x 10 days. Over-the-counter symptomatic care reviewed.  3.  Hypertension, well-controlled on amlodipine  10 mg a day, lisinopril -HCTZ 10-12.5, 1 tab daily, and Lopressor  25 mg twice daily. Electrolytes and creatinine today.  4.  Hypercholesterolemia, doing well on atorvastatin  80 mg a day. LDL was 80 in June this year. Fasting lipid panel and hepatic panel today.  5.  CAD, aortic stenosis. He is doing well status post CABG and aortic valve replacement in March this year.  Continue aspirin , statin, beta-blocker. He had postop SVT for which he takes amiodarone .  An After Visit Summary was printed and given to the patient.  Return in about 6 months (around 10/03/2024) for routine chronic illness f/u.  Signed:  Gerlene Hockey, MD           04/04/2024

## 2024-04-06 ENCOUNTER — Encounter: Payer: Self-pay | Admitting: Family Medicine

## 2024-04-06 ENCOUNTER — Ambulatory Visit: Payer: Self-pay | Admitting: Family Medicine

## 2024-04-06 MED ORDER — METFORMIN HCL ER 500 MG PO TB24: 500.0000 mg | ORAL_TABLET | Freq: Every day | ORAL | 1 refills | Status: DC

## 2024-04-16 ENCOUNTER — Other Ambulatory Visit: Payer: Self-pay

## 2024-04-16 NOTE — Telephone Encounter (Signed)
 RF request for Amiodarone  LOV: 04/04/24 Next ov: 10/03/24 Last written: 8/525 by historical provider  Please fill, if appropriate. Rx pending

## 2024-04-17 MED ORDER — AMIODARONE HCL 200 MG PO TABS: 200.0000 mg | ORAL_TABLET | Freq: Every day | ORAL | 3 refills | Status: AC

## 2024-06-04 ENCOUNTER — Other Ambulatory Visit: Payer: Self-pay

## 2024-06-04 NOTE — Telephone Encounter (Signed)
 Source  Alan Grant (Patient)   Subject  Alan Grant (Patient)   Topic  Clinical - Medication Question    Communication  Reason for CRM: pt called in because he was prescribed atorvastatin  80 mg when he was at [facility] on [address].        He is asking if pcp can start managing the medication and send a refill for him. I recommended scheduling an OV but pt preferred a message to be sent back instead since he already has an upcoming appt.        Please call and advise.   Pt was last given medication on 09/26/23 (30,3), labs done on 10/8 for lipid. Please fill, if appropriate, rx pending. His next appt is not until 10/03/24

## 2024-06-05 MED ORDER — ATORVASTATIN CALCIUM 80 MG PO TABS: 80.0000 mg | ORAL_TABLET | Freq: Every day | ORAL | 3 refills | Status: AC

## 2024-06-05 NOTE — Telephone Encounter (Signed)
Pt advised refill sent. °

## 2024-07-06 ENCOUNTER — Other Ambulatory Visit: Payer: Self-pay | Admitting: Family Medicine

## 2024-07-27 ENCOUNTER — Encounter: Payer: Self-pay | Admitting: Family Medicine

## 2024-07-27 MED ORDER — VITAMIN D (ERGOCALCIFEROL) 1.25 MG (50000 UNIT) PO CAPS: 50000.0000 [IU] | ORAL_CAPSULE | ORAL | 1 refills | Status: AC

## 2024-07-27 NOTE — Telephone Encounter (Signed)
 RF request for vitamin D  LOV: 04/04/24 Next ov: advised to f/u 6 mo Last written: 07/13/23 historical provider  Please advise. Rx pending

## 2024-08-15 ENCOUNTER — Ambulatory Visit

## 2024-10-03 ENCOUNTER — Ambulatory Visit: Admitting: Family Medicine
